# Patient Record
Sex: Female | Born: 1950 | Race: White | Hispanic: No | Smoking: Never smoker
Health system: Southern US, Community
[De-identification: ages and names within clinical notes are randomized; demographics above are authoritative.]

## PROBLEM LIST (undated history)

## (undated) DIAGNOSIS — K219 Gastro-esophageal reflux disease without esophagitis: Secondary | ICD-10-CM

## (undated) DIAGNOSIS — D509 Iron deficiency anemia, unspecified: Secondary | ICD-10-CM

## (undated) DIAGNOSIS — I10 Essential (primary) hypertension: Secondary | ICD-10-CM

## (undated) DIAGNOSIS — K7460 Unspecified cirrhosis of liver: Secondary | ICD-10-CM

## (undated) DIAGNOSIS — F1020 Alcohol dependence, uncomplicated: Secondary | ICD-10-CM

## (undated) DIAGNOSIS — I82409 Acute embolism and thrombosis of unspecified deep veins of unspecified lower extremity: Secondary | ICD-10-CM

## (undated) DIAGNOSIS — E785 Hyperlipidemia, unspecified: Secondary | ICD-10-CM

## (undated) DIAGNOSIS — K859 Acute pancreatitis without necrosis or infection, unspecified: Secondary | ICD-10-CM

## (undated) DIAGNOSIS — D539 Nutritional anemia, unspecified: Secondary | ICD-10-CM

## (undated) DIAGNOSIS — F329 Major depressive disorder, single episode, unspecified: Secondary | ICD-10-CM

## (undated) DIAGNOSIS — F32A Depression, unspecified: Secondary | ICD-10-CM

## (undated) DIAGNOSIS — F411 Generalized anxiety disorder: Secondary | ICD-10-CM

---

## 2015-09-02 ENCOUNTER — Encounter (HOSPITAL_COMMUNITY): Payer: Self-pay | Admitting: Emergency Medicine

## 2015-09-02 ENCOUNTER — Emergency Department (HOSPITAL_COMMUNITY)
Admission: EM | Admit: 2015-09-02 | Discharge: 2015-09-02 | Disposition: A | Discharge status: Home / Self Care | Payer: No Typology Code available for payment source | Attending: Emergency Medicine | Admitting: Emergency Medicine

## 2015-09-02 DIAGNOSIS — Z79891 Long term (current) use of opiate analgesic: Secondary | ICD-10-CM | POA: Diagnosis not present

## 2015-09-02 DIAGNOSIS — F329 Major depressive disorder, single episode, unspecified: Secondary | ICD-10-CM | POA: Diagnosis not present

## 2015-09-02 DIAGNOSIS — F10920 Alcohol use, unspecified with intoxication, uncomplicated: Secondary | ICD-10-CM

## 2015-09-02 DIAGNOSIS — I10 Essential (primary) hypertension: Secondary | ICD-10-CM | POA: Insufficient documentation

## 2015-09-02 DIAGNOSIS — Z79899 Other long term (current) drug therapy: Secondary | ICD-10-CM | POA: Insufficient documentation

## 2015-09-02 DIAGNOSIS — F10120 Alcohol abuse with intoxication, uncomplicated: Secondary | ICD-10-CM | POA: Diagnosis not present

## 2015-09-02 DIAGNOSIS — Z792 Long term (current) use of antibiotics: Secondary | ICD-10-CM | POA: Insufficient documentation

## 2015-09-02 DIAGNOSIS — Z7902 Long term (current) use of antithrombotics/antiplatelets: Secondary | ICD-10-CM | POA: Insufficient documentation

## 2015-09-02 DIAGNOSIS — Z8719 Personal history of other diseases of the digestive system: Secondary | ICD-10-CM | POA: Diagnosis not present

## 2015-09-02 DIAGNOSIS — F10129 Alcohol abuse with intoxication, unspecified: Secondary | ICD-10-CM | POA: Diagnosis present

## 2015-09-02 HISTORY — DX: Depression, unspecified: F32.A

## 2015-09-02 HISTORY — DX: Major depressive disorder, single episode, unspecified: F32.9

## 2015-09-02 HISTORY — DX: Essential (primary) hypertension: I10

## 2015-09-02 HISTORY — DX: Acute pancreatitis without necrosis or infection, unspecified: K85.90

## 2015-09-02 LAB — COMPREHENSIVE METABOLIC PANEL
ALBUMIN: 4.5 g/dL (ref 3.5–5.0)
ALK PHOS: 112 U/L (ref 38–126)
ALT: 55 U/L — ABNORMAL HIGH (ref 14–54)
ANION GAP: 15 (ref 5–15)
AST: 170 U/L — ABNORMAL HIGH (ref 15–41)
BILIRUBIN TOTAL: 0.5 mg/dL (ref 0.3–1.2)
BUN: 23 mg/dL — ABNORMAL HIGH (ref 6–20)
CALCIUM: 9.4 mg/dL (ref 8.9–10.3)
CO2: 28 mmol/L (ref 22–32)
Chloride: 100 mmol/L — ABNORMAL LOW (ref 101–111)
Creatinine, Ser: 0.87 mg/dL (ref 0.44–1.00)
GLUCOSE: 109 mg/dL — AB (ref 65–99)
POTASSIUM: 4.6 mmol/L (ref 3.5–5.1)
Sodium: 143 mmol/L (ref 135–145)
TOTAL PROTEIN: 7.8 g/dL (ref 6.5–8.1)

## 2015-09-02 LAB — CBC WITH DIFFERENTIAL/PLATELET
BASOS ABS: 0 10*3/uL (ref 0.0–0.1)
BASOS PCT: 0 %
EOS ABS: 0.1 10*3/uL (ref 0.0–0.7)
Eosinophils Relative: 1 %
HCT: 39.2 % (ref 36.0–46.0)
HEMOGLOBIN: 12.8 g/dL (ref 12.0–15.0)
LYMPHS PCT: 26 %
Lymphs Abs: 1.6 10*3/uL (ref 0.7–4.0)
MCH: 37 pg — AB (ref 26.0–34.0)
MCHC: 32.7 g/dL (ref 30.0–36.0)
MCV: 113.3 fL — ABNORMAL HIGH (ref 78.0–100.0)
MONO ABS: 0.8 10*3/uL (ref 0.1–1.0)
Monocytes Relative: 13 %
NEUTROS PCT: 60 %
Neutro Abs: 3.6 10*3/uL (ref 1.7–7.7)
PLATELETS: 218 10*3/uL (ref 150–400)
RBC: 3.46 MIL/uL — ABNORMAL LOW (ref 3.87–5.11)
RDW: 16.2 % — ABNORMAL HIGH (ref 11.5–15.5)
WBC: 6.1 10*3/uL (ref 4.0–10.5)

## 2015-09-02 LAB — RAPID URINE DRUG SCREEN, HOSP PERFORMED
AMPHETAMINES: NOT DETECTED
Barbiturates: NOT DETECTED
Benzodiazepines: NOT DETECTED
COCAINE: NOT DETECTED
OPIATES: NOT DETECTED
TETRAHYDROCANNABINOL: NOT DETECTED

## 2015-09-02 LAB — ETHANOL
ALCOHOL ETHYL (B): 329 mg/dL — AB (ref <5)
ALCOHOL ETHYL (B): 195 mg/dL — AB (ref <5)

## 2015-09-02 LAB — LIPASE, BLOOD: Lipase: 137 U/L — ABNORMAL HIGH (ref 11–51)

## 2015-09-02 MED ORDER — LORAZEPAM 2 MG/ML IJ SOLN: 1.0000 mg | Freq: Four times a day (QID) | INTRAMUSCULAR | Status: DC | PRN

## 2015-09-02 MED ORDER — FOLIC ACID 1 MG PO TABS
1.0000 mg | ORAL_TABLET | Freq: Every day | ORAL | Status: DC
  Administered 2015-09-02: 1 mg via ORAL
  Filled 2015-09-02: qty 1

## 2015-09-02 MED ORDER — LORAZEPAM 2 MG/ML IJ SOLN: 0.0000 mg | Freq: Two times a day (BID) | INTRAMUSCULAR | Status: DC

## 2015-09-02 MED ORDER — LORAZEPAM 2 MG/ML IJ SOLN: 0.0000 mg | Freq: Four times a day (QID) | INTRAMUSCULAR | Status: DC

## 2015-09-02 MED ORDER — SODIUM CHLORIDE 0.9 % IV BOLUS (SEPSIS)
1000.0000 mL | Freq: Once | INTRAVENOUS | Status: AC
  Administered 2015-09-02: 1000 mL via INTRAVENOUS

## 2015-09-02 MED ORDER — THIAMINE HCL 100 MG/ML IJ SOLN
100.0000 mg | Freq: Every day | INTRAMUSCULAR | Status: DC
  Filled 2015-09-02: qty 2

## 2015-09-02 MED ORDER — ADULT MULTIVITAMIN W/MINERALS CH
1.0000 | ORAL_TABLET | Freq: Every day | ORAL | Status: DC
  Administered 2015-09-02: 1 via ORAL
  Filled 2015-09-02: qty 1

## 2015-09-02 MED ORDER — VITAMIN B-1 100 MG PO TABS
100.0000 mg | ORAL_TABLET | Freq: Every day | ORAL | Status: DC
  Administered 2015-09-02: 100 mg via ORAL
  Filled 2015-09-02: qty 1

## 2015-09-02 MED ORDER — LORAZEPAM 1 MG PO TABS: 1.0000 mg | ORAL_TABLET | Freq: Four times a day (QID) | ORAL | Status: DC | PRN

## 2015-09-02 NOTE — ED Notes (Signed)
Patient aware that an urine sample is needed. Patient states she is unable to void at this time. Patient encouraged to void when able.

## 2015-09-02 NOTE — BH Assessment (Addendum)
Per Arline Aspindy at [facility] [phone], patient will be accepted back to the facility once her BAL is 200 or below and patient is medically cleared. [facility] #[phone].

## 2015-09-02 NOTE — ED Notes (Signed)
MD at bedside. 

## 2015-09-02 NOTE — ED Provider Notes (Signed)
CSN: [id number]     Arrival date & time 09/02/15  1007 History   First MD Initiated Contact with Patient 09/02/15 1014     Chief Complaint  Patient presents with  . Alcohol Problem    HPI Pt has been drinking heavily since March of this year.  She started drinking after her husband passed away in February.  Since then several other family members passed away and she has been very upset.  She drinks 1/2 pt vodka daily.  She went to [facility] today to get some help and she was sent to the ED for evaluation.  She denies abdominal pain.  No seizures.  No vomiting or diarrhea.  No tremors.  No history of DTs.  She has been depressed but denies SI or HI. Past Medical History  Diagnosis Date  . Hypertension   . Pancreatitis     x3  . Depression    History reviewed. No pertinent past surgical history. No family history on file. Social History  Substance Use Topics  . Smoking status: Never Smoker   . Smokeless tobacco: None  . Alcohol Use: Yes     Comment: 1/2 pint a day of vodka   OB History    No data available     Review of Systems  All other systems reviewed and are negative.     Allergies  Review of patient's allergies indicates no known allergies.  Home Medications   Prior to Admission medications   Medication Sig Start Date End Date Taking? Authorizing Provider  busPIRone (BUSPAR) 15 MG tablet Take 15 mg by mouth 3 (three) times daily.   Yes Historical Provider, MD  Calcium Citrate-Vitamin D (CALCIUM + D PO) Take 1 tablet by mouth daily.   Yes Historical Provider, MD  Cholecalciferol (VITAMIN D PO) Take 1 capsule by mouth daily.   Yes Historical Provider, MD  dabigatran (PRADAXA) 150 MG CAPS capsule Take 150 mg by mouth 2 (two) times daily.    Historical Provider, MD  DULoxetine (CYMBALTA) 60 MG capsule Take 60 mg by mouth daily.   Yes Historical Provider, MD  folic acid (FOLVITE) 1 MG tablet Take 1 mg by mouth daily.   Yes Historical Provider, MD  gabapentin  (NEURONTIN) 300 MG capsule Take 600 mg by mouth 3 (three) times daily.   Yes Historical Provider, MD  lisinopril (PRINIVIL,ZESTRIL) 20 MG tablet Take 20 mg by mouth daily.   Yes Historical Provider, MD  magnesium oxide (MAG-OX) 400 MG tablet Take 400 mg by mouth 3 (three) times daily.   Yes Historical Provider, MD  metroNIDAZOLE (METROGEL) 0.75 % vaginal gel Place 1 Applicatorful vaginally 2 (two) times daily.   Yes Historical Provider, MD  Multiple Vitamin (MULTIVITAMIN WITH MINERALS) TABS tablet Take 1 tablet by mouth daily.   Yes Historical Provider, MD  naltrexone (DEPADE) 50 MG tablet Take 50 mg by mouth daily.   Yes Historical Provider, MD  Omega-3 Fatty Acids (FISH OIL) 1000 MG CAPS Take 1 capsule by mouth at bedtime.   Yes Historical Provider, MD  pantoprazole (PROTONIX) 40 MG tablet Take 40 mg by mouth daily.   Yes Historical Provider, MD  thiamine 100 MG tablet Take 100 mg by mouth daily.   Yes Historical Provider, MD  traZODone (DESYREL) 50 MG tablet Take 50 mg by mouth at bedtime.   Yes Historical Provider, MD   BP 133/75 mmHg  Pulse 110  Temp(Src) 98.3 F (36.8 C) (Oral)  Resp 18  Ht 5'  3" (1.6 m)  Wt 165 lb (74.844 kg)  BMI 29.24 kg/m2  SpO2 94% Physical Exam  Constitutional: No distress.  HENT:  Head: Normocephalic and atraumatic.  Right Ear: External ear normal.  Left Ear: External ear normal.  Mild slurring of her speech   Eyes: Conjunctivae are normal. Right eye exhibits no discharge. Left eye exhibits no discharge. No scleral icterus.  Neck: Neck supple. No tracheal deviation present.  Cardiovascular: Normal rate, regular rhythm and intact distal pulses.   Pulmonary/Chest: Effort normal and breath sounds normal. No stridor. No respiratory distress. She has no wheezes. She has no rales.  Abdominal: Soft. Bowel sounds are normal. She exhibits no distension. There is no tenderness. There is no rebound and no guarding.  Musculoskeletal: She exhibits no edema or  tenderness.  Neurological: She is alert. She has normal strength. No cranial nerve deficit (no facial droop, extraocular movements intact, no slurred speech) or sensory deficit. She exhibits normal muscle tone. She displays no seizure activity. Coordination normal. GCS eye subscore is 4. GCS verbal subscore is 5. GCS motor subscore is 6.  Mild slurred speech   Skin: Skin is warm and dry. No rash noted.  Psychiatric: She has a normal mood and affect.  Nursing note and vitals reviewed.   ED Course  Procedures (including critical care time) Labs Review Labs Reviewed  COMPREHENSIVE METABOLIC PANEL - Abnormal; Notable for the following:    Chloride 100 (*)    Glucose, Bld 109 (*)    BUN 23 (*)    AST 170 (*)    ALT 55 (*)    All other components within normal limits  ETHANOL - Abnormal; Notable for the following:    Alcohol, Ethyl (B) 329 (*)    All other components within normal limits  CBC WITH DIFFERENTIAL/PLATELET - Abnormal; Notable for the following:    RBC 3.46 (*)    MCV 113.3 (*)    MCH 37.0 (*)    RDW 16.2 (*)    All other components within normal limits  LIPASE, BLOOD - Abnormal; Notable for the following:    Lipase 137 (*)    All other components within normal limits  ETHANOL - Abnormal; Notable for the following:    Alcohol, Ethyl (B) 195 (*)    All other components within normal limits  URINE RAPID DRUG SCREEN, HOSP PERFORMED    I have personally reviewed and evaluated these images and lab results as part of my medical decision-making.    MDM   Final diagnoses:  Alcohol intoxication, uncomplicated (HCC)    The patient's laboratory tests show that the patient is significantly intoxicated. She remains alert and awake. She does not appear tremulous. Patient does not appear to have any signs of acute alcohol withdrawal.  She does have an elevation of her lipase. Currently she is not experiencing any abdominal pain.  We will contact will contact [facility]  to see if she will be able to return.  [facility] requested a repeat alcohol level. They will accept once below 200. I attempted to call them directly as requested.  I left a message on the answering machine.  Alcohol level is less than 195.  Pt is stable for discharge.  No sx of withdrawal  Linwood Dibbles, MD 09/03/15 [phone]

## 2015-09-02 NOTE — ED Notes (Signed)
Bed: [id number] Expected date:  Expected time:  Means of arrival:  Comments: [facility] detox

## 2015-09-02 NOTE — Discharge Instructions (Signed)
Alcohol Intoxication  Alcohol intoxication occurs when you drink enough alcohol that it affects your ability to function. It can be mild or very severe. Drinking a lot of alcohol in a short time is called binge drinking. This can be very harmful. Drinking alcohol can also be more dangerous if you are taking medicines or other drugs. Some of the effects caused by alcohol may include:  · Loss of coordination.  · Changes in mood and behavior.  · Unclear thinking.  · Trouble talking (slurred speech).  · Throwing up (vomiting).  · Confusion.  · Slowed breathing.  · Twitching and shaking (seizures).  · Loss of consciousness.  HOME CARE  · Do not drive after drinking alcohol.  · Drink enough water and fluids to keep your pee (urine) clear or pale yellow. Avoid caffeine.  · Only take medicine as told by your doctor.  GET HELP IF:  · You throw up (vomit) many times.  · You do not feel better after a few days.  · You frequently have alcohol intoxication. Your doctor can help decide if you should see a substance use treatment counselor.  GET HELP RIGHT AWAY IF:  · You become shaky when you stop drinking.  · You have twitching and shaking.  · You throw up blood. It may look bright red or like coffee grounds.  · You notice blood in your poop (bowel movements).  · You become lightheaded or pass out (faint).  MAKE SURE YOU:   · Understand these instructions.  · Will watch your condition.  · Will get help right away if you are not doing well or get worse.     This information is not intended to replace advice given to you by your health care provider. Make sure you discuss any questions you have with your health care provider.     Document Released: 04/05/2008 Document Revised: 06/20/2013 Document Reviewed: 03/23/2013  Elsevier Interactive Patient Education ©2016 [employer]

## 2015-09-02 NOTE — ED Notes (Signed)
Per EMS patient had a 1/2 pint vodka today, which is what the patient usually drinks everyday. Patient wants detox and admits she needs help. Denies SI/HI.

## 2015-09-04 ENCOUNTER — Emergency Department (HOSPITAL_COMMUNITY): Payer: No Typology Code available for payment source

## 2015-09-04 ENCOUNTER — Emergency Department (HOSPITAL_COMMUNITY)
Admission: EM | Admit: 2015-09-04 | Discharge: 2015-09-04 | Disposition: A | Discharge status: Home / Self Care | Payer: No Typology Code available for payment source | Attending: Emergency Medicine | Admitting: Emergency Medicine

## 2015-09-04 ENCOUNTER — Encounter (HOSPITAL_COMMUNITY): Payer: Self-pay | Admitting: Emergency Medicine

## 2015-09-04 DIAGNOSIS — Z79899 Other long term (current) drug therapy: Secondary | ICD-10-CM | POA: Insufficient documentation

## 2015-09-04 DIAGNOSIS — R42 Dizziness and giddiness: Secondary | ICD-10-CM | POA: Diagnosis not present

## 2015-09-04 DIAGNOSIS — F329 Major depressive disorder, single episode, unspecified: Secondary | ICD-10-CM | POA: Insufficient documentation

## 2015-09-04 DIAGNOSIS — Z8719 Personal history of other diseases of the digestive system: Secondary | ICD-10-CM | POA: Diagnosis not present

## 2015-09-04 DIAGNOSIS — I10 Essential (primary) hypertension: Secondary | ICD-10-CM | POA: Diagnosis not present

## 2015-09-04 LAB — ETHANOL: Alcohol, Ethyl (B): <5 mg/dL (ref <5)

## 2015-09-04 LAB — COMPREHENSIVE METABOLIC PANEL
ALBUMIN: 3.5 g/dL (ref 3.5–5.0)
ALK PHOS: 99 U/L (ref 38–126)
ALT: 40 U/L (ref 14–54)
AST: 72 U/L — AB (ref 15–41)
Anion gap: 9 (ref 5–15)
BILIRUBIN TOTAL: 0.4 mg/dL (ref 0.3–1.2)
BUN: 21 mg/dL — AB (ref 6–20)
CO2: 31 mmol/L (ref 22–32)
Calcium: 9.6 mg/dL (ref 8.9–10.3)
Chloride: 99 mmol/L — ABNORMAL LOW (ref 101–111)
Creatinine, Ser: 1.2 mg/dL — ABNORMAL HIGH (ref 0.44–1.00)
GFR calc Af Amer: 55 mL/min — ABNORMAL LOW (ref >60)
GFR calc non Af Amer: 47 mL/min — ABNORMAL LOW (ref >60)
GLUCOSE: 85 mg/dL (ref 65–99)
POTASSIUM: 4.5 mmol/L (ref 3.5–5.1)
Sodium: 139 mmol/L (ref 135–145)
TOTAL PROTEIN: 6.1 g/dL — AB (ref 6.5–8.1)

## 2015-09-04 LAB — CBC
HEMATOCRIT: 33.5 % — AB (ref 36.0–46.0)
HEMOGLOBIN: 11 g/dL — AB (ref 12.0–15.0)
MCH: 37.4 pg — ABNORMAL HIGH (ref 26.0–34.0)
MCHC: 32.8 g/dL (ref 30.0–36.0)
MCV: 113.9 fL — ABNORMAL HIGH (ref 78.0–100.0)
Platelets: 161 10*3/uL (ref 150–400)
RBC: 2.94 MIL/uL — AB (ref 3.87–5.11)
RDW: 14.9 % (ref 11.5–15.5)
WBC: 3.8 10*3/uL — AB (ref 4.0–10.5)

## 2015-09-04 LAB — DIFFERENTIAL
BASOS ABS: 0 10*3/uL (ref 0.0–0.1)
Basophils Relative: 0 %
EOS PCT: 3 %
Eosinophils Absolute: 0.1 10*3/uL (ref 0.0–0.7)
Lymphocytes Relative: 41 %
Lymphs Abs: 1.6 10*3/uL (ref 0.7–4.0)
MONO ABS: 0.3 10*3/uL (ref 0.1–1.0)
MONOS PCT: 9 %
Neutro Abs: 1.8 10*3/uL (ref 1.7–7.7)
Neutrophils Relative %: 47 %

## 2015-09-04 LAB — I-STAT TROPONIN, ED: Troponin i, poc: 0.02 ng/mL (ref 0.00–0.08)

## 2015-09-04 LAB — PROTIME-INR
INR: 1.37 (ref 0.00–1.49)
Prothrombin Time: 16.9 seconds — ABNORMAL HIGH (ref 11.6–15.2)

## 2015-09-04 LAB — APTT: APTT: 53 s — AB (ref 24–37)

## 2015-09-04 LAB — POC OCCULT BLOOD, ED: Fecal Occult Bld: NEGATIVE

## 2015-09-04 MED ORDER — SODIUM CHLORIDE 0.9 % IV BOLUS (SEPSIS)
500.0000 mL | Freq: Once | INTRAVENOUS | Status: AC
  Administered 2015-09-04: 500 mL via INTRAVENOUS

## 2015-09-04 NOTE — ED Notes (Signed)
12 lead by ems unremakrable, pt was not positive orthostatic.

## 2015-09-04 NOTE — ED Notes (Signed)
Pt ambulated in hall. Pt unsteady and stated "my legs don't feel right, they are weak".

## 2015-09-04 NOTE — Discharge Instructions (Signed)

## 2015-09-04 NOTE — ED Notes (Signed)
Per [facility], pt from [facility] for alcohol rehab, last drink was three days ago. Pt sitting down and stood up, got dizzy and "foggy headed", stumbling around. Pt also states she feels very drowsy and almost fell asleep in her class which is not normal for her. AAOX4, denies pain.

## 2015-09-04 NOTE — ED Provider Notes (Signed)
CSN: [id number]     Arrival date & time 09/04/15  1755 History   First MD Initiated Contact with Patient 09/04/15 1757     Chief Complaint  Patient presents with  . Dizziness   HPI Patient presents to the emergency room for evaluation of dizziness and unsteadiness. I saw the patient in the emergency department 2 days ago at that time, the patient presented for troubles with chronic alcohol abuse. Patient was intoxicated and after her alcohol level decreased patient was sent to [facility] for further outpatient treatment of her alcohol addiction. Patient states today she was having trouble where she felt foggy headed and lightheaded. This occurred when she started to walk around. Patient felt very drowsy normal spell sleeping class. When she tried to walk she did feel like her balance was off and she was stumbling. Patient has been taking Ativan for alcohol withdrawal symptoms. She was sent in from facility for further evaluation. Past Medical History  Diagnosis Date  . Hypertension   . Pancreatitis     x3  . Depression    History reviewed. No pertinent past surgical history. No family history on file. Social History  Substance Use Topics  . Smoking status: Never Smoker   . Smokeless tobacco: None  . Alcohol Use: Yes     Comment: 1/2 pint a day of vodka   OB History    No data available     Review of Systems  All other systems reviewed and are negative.     Allergies  Review of patient's allergies indicates no known allergies.  Home Medications   Prior to Admission medications   Medication Sig Start Date End Date Taking? Authorizing Provider  alum & mag hydroxide-simeth (MAALOX/MYLANTA) 200-200-20 MG/5ML suspension Take 30 mLs by mouth every 6 (six) hours as needed for indigestion or heartburn.   Yes Historical Provider, MD  busPIRone (BUSPAR) 15 MG tablet Take 15 mg by mouth 3 (three) times daily.   Yes Historical Provider, MD  Calcium Citrate-Vitamin D (CALCIUM + D PO)  Take 1 tablet by mouth daily.   Yes Historical Provider, MD  chlordiazePOXIDE (LIBRIUM) 10 MG capsule Take 5-20 mg by mouth 4 (four) times daily. 20 mg x 4 doses, 10 mg x 4 doses, 5 mg x 4 doses   Yes Historical Provider, MD  Cholecalciferol (VITAMIN D PO) Take 1 capsule by mouth daily.   Yes Historical Provider, MD  dabigatran (PRADAXA) 150 MG CAPS capsule Take 150 mg by mouth 2 (two) times daily.   Yes Historical Provider, MD  dicyclomine (BENTYL) 20 MG tablet Take 20 mg by mouth 2 (two) times daily as needed for spasms.   Yes Historical Provider, MD  DULoxetine (CYMBALTA) 60 MG capsule Take 60 mg by mouth daily.   Yes Historical Provider, MD  folic acid (FOLVITE) 1 MG tablet Take 1 mg by mouth daily.   Yes Historical Provider, MD  gabapentin (NEURONTIN) 300 MG capsule Take 600 mg by mouth 3 (three) times daily.   Yes Historical Provider, MD  guaiFENesin (MUCINEX) 600 MG 12 hr tablet Take 600 mg by mouth 2 (two) times daily.   Yes Historical Provider, MD  lisinopril (PRINIVIL,ZESTRIL) 20 MG tablet Take 20 mg by mouth daily.   Yes Historical Provider, MD  loperamide (IMODIUM A-D) 2 MG tablet Take 2 mg by mouth 4 (four) times daily as needed for diarrhea or loose stools.   Yes Historical Provider, MD  magnesium oxide (MAG-OX) 400 MG tablet Take  400 mg by mouth 3 (three) times daily.   Yes Historical Provider, MD  methocarbamol (ROBAXIN) 500 MG tablet Take 500 mg by mouth every 8 (eight) hours as needed for muscle spasms.   Yes Historical Provider, MD  metroNIDAZOLE (METROGEL) 0.75 % vaginal gel Place 1 Applicatorful vaginally 2 (two) times daily as needed (yeast infections).    Yes Historical Provider, MD  Multiple Vitamin (MULTIVITAMIN WITH MINERALS) TABS tablet Take 1 tablet by mouth daily.   Yes Historical Provider, MD  naltrexone (DEPADE) 50 MG tablet Take 50 mg by mouth daily.   Yes Historical Provider, MD  Omega-3 Fatty Acids (FISH OIL) 1000 MG CAPS Take 1 capsule by mouth at bedtime.   Yes  Historical Provider, MD  pantoprazole (PROTONIX) 40 MG tablet Take 40 mg by mouth daily.   Yes Historical Provider, MD  thiamine 100 MG tablet Take 100 mg by mouth daily.   Yes Historical Provider, MD  traZODone (DESYREL) 50 MG tablet Take 50 mg by mouth at bedtime.   Yes Historical Provider, MD   BP 131/81 mmHg  Pulse 89  Temp(Src) 98.3 F (36.8 C) (Oral)  Resp 17  SpO2 93% Physical Exam  Constitutional: She is oriented to person, place, and time. She appears well-developed and well-nourished. No distress.  HENT:  Head: Normocephalic and atraumatic.  Right Ear: External ear normal.  Left Ear: External ear normal.  Mouth/Throat: Oropharynx is clear and moist.  Eyes: Conjunctivae are normal. Right eye exhibits no discharge. Left eye exhibits no discharge. No scleral icterus.  Neck: Neck supple. No tracheal deviation present.  Cardiovascular: Normal rate, regular rhythm and intact distal pulses.   Pulmonary/Chest: Effort normal and breath sounds normal. No stridor. No respiratory distress. She has no wheezes. She has no rales.  Abdominal: Soft. Bowel sounds are normal. She exhibits no distension. There is no tenderness. There is no rebound and no guarding.  Musculoskeletal: She exhibits no edema or tenderness.  Neurological: She is alert and oriented to person, place, and time. She has normal strength. No cranial nerve deficit (No facial droop, extraocular movements intact, tongue midline ) or sensory deficit. She exhibits normal muscle tone. She displays no seizure activity. Coordination normal.  No pronator drift bilateral upper extrem, able to hold both legs off bed for 5 seconds, sensation intact in all extremities, no visual field cuts, no left or right sided neglect, normal finger-nose exam bilaterally, no nystagmus noted   Skin: Skin is warm and dry. No rash noted.  Psychiatric: She has a normal mood and affect.  Nursing note and vitals reviewed.   ED Course  Procedures  (including critical care time) Labs Review Labs Reviewed  PROTIME-INR - Abnormal; Notable for the following:    Prothrombin Time 16.9 (*)    All other components within normal limits  APTT - Abnormal; Notable for the following:    aPTT 53 (*)    All other components within normal limits  CBC - Abnormal; Notable for the following:    WBC 3.8 (*)    RBC 2.94 (*)    Hemoglobin 11.0 (*)    HCT 33.5 (*)    MCV 113.9 (*)    MCH 37.4 (*)    All other components within normal limits  COMPREHENSIVE METABOLIC PANEL - Abnormal; Notable for the following:    Chloride 99 (*)    BUN 21 (*)    Creatinine, Ser 1.20 (*)    Total Protein 6.1 (*)    AST 72 (*)  GFR calc non Af Amer 47 (*)    GFR calc Af Amer 55 (*)    All other components within normal limits  ETHANOL  DIFFERENTIAL  I-STAT TROPOININ, ED  POC OCCULT BLOOD, ED    Imaging Review Mr Brain Wo Contrast  09/04/2015  CLINICAL DATA:  Dizziness. Imbalance. Alcohol abuse with recent withdrawal. EXAM: MRI HEAD WITHOUT CONTRAST TECHNIQUE: Multiplanar, multiecho pulse sequences of the brain and surrounding structures were obtained without intravenous contrast. COMPARISON:  None. FINDINGS: Mild atrophy and periventricular T2 changes bilaterally are within normal limits for age. No acute infarct, hemorrhage, or mass lesion is present. The ventricles are of normal size. No significant extraaxial fluid collection is present. Mild white matter changes extend into the brainstem. The cerebellum is unremarkable. Flow is present in the major intracranial arteries. Globes orbits are intact. The paranasal sinuses and mastoid air cells are clear. The skullbase is within normal limits. Midline structures are unremarkable. IMPRESSION: 1. Mild atrophy and white matter changes are within normal limits for age. 2. No acute or focal intracranial abnormality to explain the patient's symptoms. Electronically Signed   By: Marin Robertshristopher  Mattern M.D.   On: 09/04/2015  20:18   I have personally reviewed and evaluated these images and lab results as part of my medical decision-making.   EKG Interpretation   Date/Time:  Thursday September 04 2015 18:32:12 EDT Ventricular Rate:  95 PR Interval:  189 QRS Duration: 74 QT Interval:  352 QTC Calculation: 442 R Axis:   22 Text Interpretation:  Sinus rhythm Consider anterior infarct No old  tracing to compare Confirmed by Clementina Mareno  MD-J, Sylwia Cuervo ([id number]) on 09/04/2015  6:41:14 PM      MDM   Final diagnoses:  Dizziness    Patient's symptoms started this afternoon. Her NIH stroke score is 0 on my exam.  Do not think she is a code stroke/TPA candidate. I will order an MRI of the brain to rule out any occult history circulation stroke.  SX may be related to her benzodiazepine medication.  Patient's MRI does not show any evidence of acute abnormality.  Her unsteadiness could be related to her medications. It also is possible that vitamin deficiency associated with her alcohol abuse could be a contributing factor. She is currently on supplemental folic acid and thiamine.  She has mild renal insufficiency and anemia. Guaiac is negative. She does not appear to have an acute GI bleed. I doubt that these would be the cause of her symptoms  At this time there does not appear to be any evidence of an acute emergency medical condition and the patient appears stable for discharge with appropriate outpatient follow up.  She will need to follow up with her primary care doctor  to evaluate further     Linwood DibblesJon Seanmichael Salmons, MD 09/04/15 2225

## 2015-09-07 ENCOUNTER — Inpatient Hospital Stay (HOSPITAL_COMMUNITY)
Admission: EM | Admit: 2015-09-07 | Discharge: 2015-09-16 | DRG: 871 | Disposition: A | Discharge status: Other Acute Inpatient Hospital | Payer: No Typology Code available for payment source | Attending: Internal Medicine | Admitting: Internal Medicine

## 2015-09-07 ENCOUNTER — Emergency Department (HOSPITAL_COMMUNITY): Payer: No Typology Code available for payment source

## 2015-09-07 ENCOUNTER — Encounter (HOSPITAL_COMMUNITY): Payer: Self-pay

## 2015-09-07 DIAGNOSIS — A419 Sepsis, unspecified organism: Principal | ICD-10-CM | POA: Diagnosis present

## 2015-09-07 DIAGNOSIS — E8770 Fluid overload, unspecified: Secondary | ICD-10-CM | POA: Diagnosis present

## 2015-09-07 DIAGNOSIS — R0602 Shortness of breath: Secondary | ICD-10-CM | POA: Diagnosis not present

## 2015-09-07 DIAGNOSIS — F411 Generalized anxiety disorder: Secondary | ICD-10-CM | POA: Diagnosis present

## 2015-09-07 DIAGNOSIS — Z86718 Personal history of other venous thrombosis and embolism: Secondary | ICD-10-CM | POA: Diagnosis not present

## 2015-09-07 DIAGNOSIS — I119 Hypertensive heart disease without heart failure: Secondary | ICD-10-CM | POA: Diagnosis present

## 2015-09-07 DIAGNOSIS — Z7901 Long term (current) use of anticoagulants: Secondary | ICD-10-CM | POA: Diagnosis not present

## 2015-09-07 DIAGNOSIS — Z807 Family history of other malignant neoplasms of lymphoid, hematopoietic and related tissues: Secondary | ICD-10-CM | POA: Diagnosis not present

## 2015-09-07 DIAGNOSIS — F329 Major depressive disorder, single episode, unspecified: Secondary | ICD-10-CM | POA: Diagnosis not present

## 2015-09-07 DIAGNOSIS — K219 Gastro-esophageal reflux disease without esophagitis: Secondary | ICD-10-CM | POA: Diagnosis present

## 2015-09-07 DIAGNOSIS — F1020 Alcohol dependence, uncomplicated: Secondary | ICD-10-CM | POA: Diagnosis not present

## 2015-09-07 DIAGNOSIS — I248 Other forms of acute ischemic heart disease: Secondary | ICD-10-CM | POA: Diagnosis present

## 2015-09-07 DIAGNOSIS — E876 Hypokalemia: Secondary | ICD-10-CM | POA: Diagnosis present

## 2015-09-07 DIAGNOSIS — E0781 Sick-euthyroid syndrome: Secondary | ICD-10-CM | POA: Diagnosis present

## 2015-09-07 DIAGNOSIS — D509 Iron deficiency anemia, unspecified: Secondary | ICD-10-CM | POA: Diagnosis not present

## 2015-09-07 DIAGNOSIS — J9601 Acute respiratory failure with hypoxia: Secondary | ICD-10-CM | POA: Diagnosis not present

## 2015-09-07 DIAGNOSIS — Z79899 Other long term (current) drug therapy: Secondary | ICD-10-CM

## 2015-09-07 DIAGNOSIS — I82409 Acute embolism and thrombosis of unspecified deep veins of unspecified lower extremity: Secondary | ICD-10-CM | POA: Diagnosis not present

## 2015-09-07 DIAGNOSIS — F32A Depression, unspecified: Secondary | ICD-10-CM | POA: Diagnosis present

## 2015-09-07 DIAGNOSIS — R7989 Other specified abnormal findings of blood chemistry: Secondary | ICD-10-CM | POA: Diagnosis not present

## 2015-09-07 DIAGNOSIS — K7030 Alcoholic cirrhosis of liver without ascites: Secondary | ICD-10-CM | POA: Diagnosis not present

## 2015-09-07 DIAGNOSIS — J189 Pneumonia, unspecified organism: Secondary | ICD-10-CM | POA: Diagnosis not present

## 2015-09-07 DIAGNOSIS — R778 Other specified abnormalities of plasma proteins: Secondary | ICD-10-CM | POA: Diagnosis present

## 2015-09-07 DIAGNOSIS — K7460 Unspecified cirrhosis of liver: Secondary | ICD-10-CM | POA: Diagnosis present

## 2015-09-07 DIAGNOSIS — E785 Hyperlipidemia, unspecified: Secondary | ICD-10-CM | POA: Diagnosis present

## 2015-09-07 DIAGNOSIS — R0600 Dyspnea, unspecified: Secondary | ICD-10-CM | POA: Diagnosis not present

## 2015-09-07 DIAGNOSIS — J811 Chronic pulmonary edema: Secondary | ICD-10-CM

## 2015-09-07 DIAGNOSIS — D539 Nutritional anemia, unspecified: Secondary | ICD-10-CM | POA: Diagnosis present

## 2015-09-07 DIAGNOSIS — Y95 Nosocomial condition: Secondary | ICD-10-CM | POA: Diagnosis present

## 2015-09-07 HISTORY — DX: Unspecified cirrhosis of liver: K74.60

## 2015-09-07 HISTORY — DX: Nutritional anemia, unspecified: D53.9

## 2015-09-07 HISTORY — DX: Hyperlipidemia, unspecified: E78.5

## 2015-09-07 HISTORY — DX: Generalized anxiety disorder: F41.1

## 2015-09-07 HISTORY — DX: Gastro-esophageal reflux disease without esophagitis: K21.9

## 2015-09-07 HISTORY — DX: Iron deficiency anemia, unspecified: D50.9

## 2015-09-07 HISTORY — DX: Alcohol dependence, uncomplicated: F10.20

## 2015-09-07 HISTORY — DX: Acute embolism and thrombosis of unspecified deep veins of unspecified lower extremity: I82.409

## 2015-09-07 LAB — URINALYSIS, ROUTINE W REFLEX MICROSCOPIC
Bilirubin Urine: NEGATIVE
Glucose, UA: >1000 mg/dL — AB
Hgb urine dipstick: NEGATIVE
Ketones, ur: NEGATIVE mg/dL
LEUKOCYTES UA: NEGATIVE
NITRITE: NEGATIVE
Protein, ur: NEGATIVE mg/dL
Specific Gravity, Urine: >1.046 — ABNORMAL HIGH (ref 1.005–1.030)
UROBILINOGEN UA: 0.2 mg/dL (ref 0.0–1.0)
pH: 5.5 (ref 5.0–8.0)

## 2015-09-07 LAB — PROCALCITONIN: Procalcitonin: 1.5 ng/mL

## 2015-09-07 LAB — COMPREHENSIVE METABOLIC PANEL
ALBUMIN: 3.4 g/dL — AB (ref 3.5–5.0)
ALT: 35 U/L (ref 14–54)
ANION GAP: 11 (ref 5–15)
AST: 48 U/L — AB (ref 15–41)
Alkaline Phosphatase: 86 U/L (ref 38–126)
BUN: 25 mg/dL — AB (ref 6–20)
CHLORIDE: 100 mmol/L — AB (ref 101–111)
CO2: 30 mmol/L (ref 22–32)
Calcium: 9 mg/dL (ref 8.9–10.3)
Creatinine, Ser: 1.02 mg/dL — ABNORMAL HIGH (ref 0.44–1.00)
GFR calc Af Amer: >60 mL/min (ref >60)
GFR calc non Af Amer: 57 mL/min — ABNORMAL LOW (ref >60)
GLUCOSE: 243 mg/dL — AB (ref 65–99)
POTASSIUM: 3.2 mmol/L — AB (ref 3.5–5.1)
SODIUM: 141 mmol/L (ref 135–145)
Total Bilirubin: 0.6 mg/dL (ref 0.3–1.2)
Total Protein: 6.5 g/dL (ref 6.5–8.1)

## 2015-09-07 LAB — FERRITIN: FERRITIN: 153 ng/mL (ref 11–307)

## 2015-09-07 LAB — BLOOD GAS, ARTERIAL
ACID-BASE DEFICIT: 1.8 mmol/L (ref 0.0–2.0)
Bicarbonate: 23.4 mEq/L (ref 20.0–24.0)
Drawn by: 441261
O2 Content: 2 L/min
O2 Saturation: 94.1 %
PCO2 ART: 44.7 mmHg (ref 35.0–45.0)
PO2 ART: 75.1 mmHg — AB (ref 80.0–100.0)
Patient temperature: 98.6
TCO2: 22 mmol/L (ref 0–100)
pH, Arterial: 7.34 — ABNORMAL LOW (ref 7.350–7.450)

## 2015-09-07 LAB — CBC WITH DIFFERENTIAL/PLATELET
BASOS ABS: 0 10*3/uL (ref 0.0–0.1)
Basophils Relative: 0 %
EOS ABS: 0 10*3/uL (ref 0.0–0.7)
Eosinophils Relative: 0 %
HEMATOCRIT: 34.1 % — AB (ref 36.0–46.0)
HEMOGLOBIN: 11.5 g/dL — AB (ref 12.0–15.0)
LYMPHS ABS: 0.9 10*3/uL (ref 0.7–4.0)
LYMPHS PCT: 14 %
MCH: 37.7 pg — ABNORMAL HIGH (ref 26.0–34.0)
MCHC: 33.7 g/dL (ref 30.0–36.0)
MCV: 111.8 fL — ABNORMAL HIGH (ref 78.0–100.0)
MONOS PCT: 8 %
Monocytes Absolute: 0.5 10*3/uL (ref 0.1–1.0)
NEUTROS ABS: 5 10*3/uL (ref 1.7–7.7)
Neutrophils Relative %: 78 %
Platelets: 162 10*3/uL (ref 150–400)
RBC: 3.05 MIL/uL — AB (ref 3.87–5.11)
RDW: 14.5 % (ref 11.5–15.5)
WBC: 6.4 10*3/uL (ref 4.0–10.5)

## 2015-09-07 LAB — INFLUENZA PANEL BY PCR (TYPE A & B)
H1N1FLUPCR: NOT DETECTED
Influenza A By PCR: NEGATIVE
Influenza B By PCR: NEGATIVE

## 2015-09-07 LAB — IRON AND TIBC
Iron: 8 ug/dL — ABNORMAL LOW (ref 28–170)
Saturation Ratios: 4 % — ABNORMAL LOW (ref 10.4–31.8)
TIBC: 227 ug/dL — ABNORMAL LOW (ref 250–450)
UIBC: 219 ug/dL

## 2015-09-07 LAB — I-STAT CG4 LACTIC ACID, ED
LACTIC ACID, VENOUS: 2.82 mmol/L — AB (ref 0.5–2.0)
LACTIC ACID, VENOUS: 2.94 mmol/L — AB (ref 0.5–2.0)

## 2015-09-07 LAB — TROPONIN I
TROPONIN I: 0.24 ng/mL — AB (ref <0.031)
TROPONIN I: 0.19 ng/mL — AB (ref <0.031)
TROPONIN I: 0.09 ng/mL — AB (ref <0.031)

## 2015-09-07 LAB — I-STAT TROPONIN, ED: Troponin i, poc: 0.24 ng/mL (ref 0.00–0.08)

## 2015-09-07 LAB — MAGNESIUM
Magnesium: 0.9 mg/dL — CL (ref 1.7–2.4)
Magnesium: 1 mg/dL — ABNORMAL LOW (ref 1.7–2.4)

## 2015-09-07 LAB — TYPE AND SCREEN
ABO/RH(D): O POS
ANTIBODY SCREEN: NEGATIVE

## 2015-09-07 LAB — LACTIC ACID, PLASMA
LACTIC ACID, VENOUS: 2.7 mmol/L — AB (ref 0.5–2.0)
Lactic Acid, Venous: 2.9 mmol/L (ref 0.5–2.0)
Lactic Acid, Venous: 2 mmol/L (ref 0.5–2.0)

## 2015-09-07 LAB — BRAIN NATRIURETIC PEPTIDE: B Natriuretic Peptide: 191 pg/mL — ABNORMAL HIGH (ref 0.0–100.0)

## 2015-09-07 LAB — MRSA PCR SCREENING: MRSA BY PCR: NEGATIVE

## 2015-09-07 LAB — URINE MICROSCOPIC-ADD ON

## 2015-09-07 LAB — TSH: TSH: 0.18 u[IU]/mL — ABNORMAL LOW (ref 0.350–4.500)

## 2015-09-07 LAB — ETHANOL

## 2015-09-07 LAB — APTT: aPTT: 43 seconds — ABNORMAL HIGH (ref 24–37)

## 2015-09-07 LAB — FOLATE: Folate: 31 ng/mL (ref >5.9)

## 2015-09-07 LAB — PROTIME-INR
INR: 1.22 (ref 0.00–1.49)
Prothrombin Time: 15.5 seconds — ABNORMAL HIGH (ref 11.6–15.2)

## 2015-09-07 LAB — RETICULOCYTES
RBC.: 2.68 MIL/uL — AB (ref 3.87–5.11)
RETIC COUNT ABSOLUTE: 99.2 10*3/uL (ref 19.0–186.0)
Retic Ct Pct: 3.7 % — ABNORMAL HIGH (ref 0.4–3.1)

## 2015-09-07 LAB — LIPASE, BLOOD: LIPASE: 57 U/L — AB (ref 11–51)

## 2015-09-07 LAB — VITAMIN B12: Vitamin B-12: 304 pg/mL (ref 180–914)

## 2015-09-07 LAB — STREP PNEUMONIAE URINARY ANTIGEN: Strep Pneumo Urinary Antigen: NEGATIVE

## 2015-09-07 MED ORDER — ADULT MULTIVITAMIN W/MINERALS CH: 1.0000 | ORAL_TABLET | Freq: Every day | ORAL | Status: DC

## 2015-09-07 MED ORDER — THIAMINE HCL 100 MG/ML IJ SOLN
Freq: Once | INTRAVENOUS | Status: AC
  Administered 2015-09-07: 14:00:00 via INTRAVENOUS
  Filled 2015-09-07: qty 1000

## 2015-09-07 MED ORDER — BUSPIRONE HCL 5 MG PO TABS
15.0000 mg | ORAL_TABLET | Freq: Three times a day (TID) | ORAL | Status: DC
  Administered 2015-09-07 – 2015-09-16 (×27): 15 mg via ORAL
  Filled 2015-09-07: qty 1
  Filled 2015-09-07: qty 3
  Filled 2015-09-07: qty 1
  Filled 2015-09-07: qty 3
  Filled 2015-09-07 (×2): qty 1
  Filled 2015-09-07: qty 3
  Filled 2015-09-07 (×4): qty 1
  Filled 2015-09-07: qty 3
  Filled 2015-09-07: qty 1
  Filled 2015-09-07: qty 3
  Filled 2015-09-07 (×2): qty 1
  Filled 2015-09-07: qty 3
  Filled 2015-09-07: qty 1
  Filled 2015-09-07: qty 3
  Filled 2015-09-07 (×3): qty 1
  Filled 2015-09-07 (×3): qty 3
  Filled 2015-09-07: qty 1
  Filled 2015-09-07: qty 3
  Filled 2015-09-07: qty 1
  Filled 2015-09-07 (×3): qty 3
  Filled 2015-09-07: qty 1
  Filled 2015-09-07: qty 3

## 2015-09-07 MED ORDER — METHOCARBAMOL 500 MG PO TABS
500.0000 mg | ORAL_TABLET | Freq: Three times a day (TID) | ORAL | Status: DC | PRN
  Administered 2015-09-09: 500 mg via ORAL
  Filled 2015-09-07: qty 1

## 2015-09-07 MED ORDER — GUAIFENESIN ER 600 MG PO TB12
1200.0000 mg | ORAL_TABLET | Freq: Two times a day (BID) | ORAL | Status: DC
Start: 2015-09-07 — End: 2015-09-16
  Administered 2015-09-07 – 2015-09-16 (×18): 1200 mg via ORAL
  Filled 2015-09-07 (×20): qty 2

## 2015-09-07 MED ORDER — IOHEXOL 300 MG/ML  SOLN
75.0000 mL | Freq: Once | INTRAMUSCULAR | Status: AC | PRN
  Administered 2015-09-07: 75 mL via INTRAVENOUS

## 2015-09-07 MED ORDER — VANCOMYCIN HCL IN DEXTROSE 1-5 GM/200ML-% IV SOLN
1000.0000 mg | Freq: Two times a day (BID) | INTRAVENOUS | Status: DC
  Administered 2015-09-07 – 2015-09-08 (×3): 1000 mg via INTRAVENOUS
  Filled 2015-09-07 (×3): qty 200

## 2015-09-07 MED ORDER — ADULT MULTIVITAMIN W/MINERALS CH
1.0000 | ORAL_TABLET | Freq: Every day | ORAL | Status: DC
  Administered 2015-09-07 – 2015-09-16 (×10): 1 via ORAL
  Filled 2015-09-07 (×10): qty 1

## 2015-09-07 MED ORDER — IPRATROPIUM BROMIDE 0.02 % IN SOLN
0.5000 mg | Freq: Four times a day (QID) | RESPIRATORY_TRACT | Status: DC
  Administered 2015-09-07 – 2015-09-09 (×7): 0.5 mg via RESPIRATORY_TRACT
  Filled 2015-09-07 (×6): qty 2.5

## 2015-09-07 MED ORDER — PIPERACILLIN-TAZOBACTAM 3.375 G IVPB 30 MIN
3.3750 g | Freq: Once | INTRAVENOUS | Status: AC
  Administered 2015-09-07: 3.375 g via INTRAVENOUS

## 2015-09-07 MED ORDER — LEVALBUTEROL HCL 0.63 MG/3ML IN NEBU
0.6300 mg | INHALATION_SOLUTION | Freq: Four times a day (QID) | RESPIRATORY_TRACT | Status: DC
  Administered 2015-09-07 – 2015-09-09 (×7): 0.63 mg via RESPIRATORY_TRACT
  Filled 2015-09-07 (×6): qty 3

## 2015-09-07 MED ORDER — LORAZEPAM 2 MG/ML IJ SOLN: 0.0000 mg | Freq: Two times a day (BID) | INTRAMUSCULAR | Status: AC

## 2015-09-07 MED ORDER — PANTOPRAZOLE SODIUM 40 MG PO TBEC
40.0000 mg | DELAYED_RELEASE_TABLET | Freq: Every day | ORAL | Status: DC
  Administered 2015-09-08 – 2015-09-16 (×9): 40 mg via ORAL
  Filled 2015-09-07 (×9): qty 1

## 2015-09-07 MED ORDER — VITAMIN B-1 100 MG PO TABS
100.0000 mg | ORAL_TABLET | Freq: Every day | ORAL | Status: DC
  Administered 2015-09-07 – 2015-09-16 (×9): 100 mg via ORAL
  Filled 2015-09-07 (×10): qty 1

## 2015-09-07 MED ORDER — MAGNESIUM SULFATE 4 GM/100ML IV SOLN
4.0000 g | Freq: Once | INTRAVENOUS | Status: AC
  Administered 2015-09-07: 4 g via INTRAVENOUS
  Filled 2015-09-07: qty 100

## 2015-09-07 MED ORDER — LEVALBUTEROL HCL 0.63 MG/3ML IN NEBU: 0.6300 mg | INHALATION_SOLUTION | Freq: Three times a day (TID) | RESPIRATORY_TRACT | Status: DC

## 2015-09-07 MED ORDER — THIAMINE HCL 100 MG/ML IJ SOLN
100.0000 mg | Freq: Every day | INTRAMUSCULAR | Status: DC
Start: 2015-09-07 — End: 2015-09-14
  Administered 2015-09-10: 100 mg via INTRAVENOUS
  Filled 2015-09-07: qty 2

## 2015-09-07 MED ORDER — LORAZEPAM 1 MG PO TABS: 1.0000 mg | ORAL_TABLET | Freq: Four times a day (QID) | ORAL | Status: AC | PRN

## 2015-09-07 MED ORDER — SODIUM CHLORIDE 0.9 % IV BOLUS (SEPSIS)
500.0000 mL | INTRAVENOUS | Status: AC
  Administered 2015-09-07: 500 mL via INTRAVENOUS

## 2015-09-07 MED ORDER — LORATADINE 10 MG PO TABS
10.0000 mg | ORAL_TABLET | Freq: Every day | ORAL | Status: DC
Start: 2015-09-08 — End: 2015-09-16
  Administered 2015-09-08 – 2015-09-16 (×9): 10 mg via ORAL
  Filled 2015-09-07 (×9): qty 1

## 2015-09-07 MED ORDER — MAGNESIUM OXIDE 400 (241.3 MG) MG PO TABS
400.0000 mg | ORAL_TABLET | Freq: Three times a day (TID) | ORAL | Status: DC
  Administered 2015-09-07 – 2015-09-16 (×27): 400 mg via ORAL
  Filled 2015-09-07 (×27): qty 1

## 2015-09-07 MED ORDER — LEVALBUTEROL HCL 0.63 MG/3ML IN NEBU: 0.6300 mg | INHALATION_SOLUTION | RESPIRATORY_TRACT | Status: DC | PRN

## 2015-09-07 MED ORDER — BENZONATATE 100 MG PO CAPS: 100.0000 mg | ORAL_CAPSULE | Freq: Three times a day (TID) | ORAL | Status: DC | PRN

## 2015-09-07 MED ORDER — PIPERACILLIN-TAZOBACTAM 3.375 G IVPB 30 MIN
3.3750 g | Freq: Three times a day (TID) | INTRAVENOUS | Status: DC
  Filled 2015-09-07: qty 50

## 2015-09-07 MED ORDER — ONDANSETRON HCL 4 MG/2ML IJ SOLN
4.0000 mg | Freq: Four times a day (QID) | INTRAMUSCULAR | Status: DC | PRN
Start: 2015-09-07 — End: 2015-09-16
  Administered 2015-09-09: 4 mg via INTRAVENOUS
  Filled 2015-09-07: qty 2

## 2015-09-07 MED ORDER — SODIUM CHLORIDE 0.9 % IV BOLUS (SEPSIS)
1000.0000 mL | INTRAVENOUS | Status: AC
  Administered 2015-09-07 (×2): 1000 mL via INTRAVENOUS

## 2015-09-07 MED ORDER — DICYCLOMINE HCL 20 MG PO TABS
20.0000 mg | ORAL_TABLET | Freq: Two times a day (BID) | ORAL | Status: DC | PRN
  Filled 2015-09-07: qty 1

## 2015-09-07 MED ORDER — IPRATROPIUM BROMIDE 0.02 % IN SOLN
0.5000 mg | RESPIRATORY_TRACT | Status: DC | PRN
  Filled 2015-09-07: qty 2.5

## 2015-09-07 MED ORDER — POTASSIUM CHLORIDE CRYS ER 20 MEQ PO TBCR
40.0000 meq | EXTENDED_RELEASE_TABLET | Freq: Once | ORAL | Status: AC
  Administered 2015-09-07: 40 meq via ORAL
  Filled 2015-09-07: qty 2

## 2015-09-07 MED ORDER — LORAZEPAM 2 MG/ML IJ SOLN
0.5000 mg | Freq: Once | INTRAMUSCULAR | Status: AC
  Administered 2015-09-07: 0.5 mg via INTRAVENOUS
  Filled 2015-09-07: qty 1

## 2015-09-07 MED ORDER — ASPIRIN 81 MG PO CHEW
CHEWABLE_TABLET | ORAL | Status: AC
  Administered 2015-09-07: 324 mg via ORAL
  Filled 2015-09-07: qty 4

## 2015-09-07 MED ORDER — SODIUM CHLORIDE 0.9 % IV SOLN
INTRAVENOUS | Status: DC
  Administered 2015-09-07 – 2015-09-08 (×2): via INTRAVENOUS

## 2015-09-07 MED ORDER — BENZOCAINE-MENTHOL 6-10 MG MT LOZG: 1.0000 | LOZENGE | OROMUCOSAL | Status: DC | PRN

## 2015-09-07 MED ORDER — IBUPROFEN 200 MG PO TABS
400.0000 mg | ORAL_TABLET | Freq: Four times a day (QID) | ORAL | Status: DC | PRN
  Administered 2015-09-08 – 2015-09-11 (×6): 400 mg via ORAL
  Filled 2015-09-07 (×6): qty 2

## 2015-09-07 MED ORDER — VANCOMYCIN HCL IN DEXTROSE 1-5 GM/200ML-% IV SOLN
1000.0000 mg | Freq: Once | INTRAVENOUS | Status: AC
  Administered 2015-09-07: 1000 mg via INTRAVENOUS
  Filled 2015-09-07: qty 200

## 2015-09-07 MED ORDER — TRAZODONE HCL 50 MG PO TABS
50.0000 mg | ORAL_TABLET | Freq: Every day | ORAL | Status: DC
  Administered 2015-09-07 – 2015-09-15 (×9): 50 mg via ORAL
  Filled 2015-09-07 (×9): qty 1

## 2015-09-07 MED ORDER — VANCOMYCIN HCL IN DEXTROSE 1-5 GM/200ML-% IV SOLN: 1000.0000 mg | Freq: Once | INTRAVENOUS | Status: DC

## 2015-09-07 MED ORDER — ASPIRIN 81 MG PO CHEW
324.0000 mg | CHEWABLE_TABLET | Freq: Once | ORAL | Status: AC
  Administered 2015-09-07: 324 mg via ORAL

## 2015-09-07 MED ORDER — ASPIRIN 325 MG PO TABS: 325.0000 mg | ORAL_TABLET | Freq: Once | ORAL | Status: DC

## 2015-09-07 MED ORDER — ACETAMINOPHEN 500 MG PO TABS
1000.0000 mg | ORAL_TABLET | Freq: Once | ORAL | Status: AC
  Administered 2015-09-07: 1000 mg via ORAL
  Filled 2015-09-07: qty 2

## 2015-09-07 MED ORDER — FOLIC ACID 1 MG PO TABS
1.0000 mg | ORAL_TABLET | Freq: Every day | ORAL | Status: DC
  Administered 2015-09-07 – 2015-09-16 (×10): 1 mg via ORAL
  Filled 2015-09-07 (×10): qty 1

## 2015-09-07 MED ORDER — SODIUM CHLORIDE 0.9 % IV BOLUS (SEPSIS)
1000.0000 mL | Freq: Once | INTRAVENOUS | Status: AC
  Administered 2015-09-07: 1000 mL via INTRAVENOUS

## 2015-09-07 MED ORDER — LOPERAMIDE HCL 2 MG PO CAPS: 2.0000 mg | ORAL_CAPSULE | Freq: Four times a day (QID) | ORAL | Status: DC | PRN

## 2015-09-07 MED ORDER — LORAZEPAM 2 MG/ML IJ SOLN
1.0000 mg | Freq: Four times a day (QID) | INTRAMUSCULAR | Status: AC | PRN
  Administered 2015-09-09: 1 mg via INTRAVENOUS
  Filled 2015-09-07: qty 1

## 2015-09-07 MED ORDER — LORAZEPAM 2 MG/ML IJ SOLN
0.0000 mg | Freq: Four times a day (QID) | INTRAMUSCULAR | Status: AC
  Administered 2015-09-07 – 2015-09-09 (×2): 1 mg via INTRAVENOUS
  Filled 2015-09-07 (×2): qty 1

## 2015-09-07 MED ORDER — LIDOCAINE 5 % EX PTCH
1.0000 | MEDICATED_PATCH | CUTANEOUS | Status: DC
  Administered 2015-09-08 – 2015-09-16 (×9): 1 via TRANSDERMAL
  Filled 2015-09-07 (×12): qty 1

## 2015-09-07 MED ORDER — DULOXETINE HCL 60 MG PO CPEP
60.0000 mg | ORAL_CAPSULE | Freq: Every day | ORAL | Status: DC
  Administered 2015-09-08 – 2015-09-16 (×9): 60 mg via ORAL
  Filled 2015-09-07: qty 2
  Filled 2015-09-07: qty 1
  Filled 2015-09-07: qty 2
  Filled 2015-09-07 (×3): qty 1
  Filled 2015-09-07: qty 2
  Filled 2015-09-07: qty 1
  Filled 2015-09-07: qty 2

## 2015-09-07 MED ORDER — GABAPENTIN 300 MG PO CAPS
600.0000 mg | ORAL_CAPSULE | Freq: Three times a day (TID) | ORAL | Status: DC
  Administered 2015-09-07 – 2015-09-16 (×27): 600 mg via ORAL
  Filled 2015-09-07 (×41): qty 2

## 2015-09-07 MED ORDER — PIPERACILLIN-TAZOBACTAM 3.375 G IVPB
3.3750 g | Freq: Three times a day (TID) | INTRAVENOUS | Status: DC
  Administered 2015-09-07 – 2015-09-15 (×23): 3.375 g via INTRAVENOUS
  Filled 2015-09-07 (×23): qty 50

## 2015-09-07 MED ORDER — DABIGATRAN ETEXILATE MESYLATE 150 MG PO CAPS
150.0000 mg | ORAL_CAPSULE | Freq: Two times a day (BID) | ORAL | Status: DC
  Administered 2015-09-07 – 2015-09-16 (×18): 150 mg via ORAL
  Filled 2015-09-07 (×21): qty 1

## 2015-09-07 MED ORDER — PIPERACILLIN-TAZOBACTAM 3.375 G IVPB 30 MIN: 3.3750 g | Freq: Three times a day (TID) | INTRAVENOUS | Status: DC

## 2015-09-07 MED ORDER — LEVOFLOXACIN IN D5W 750 MG/150ML IV SOLN
750.0000 mg | Freq: Once | INTRAVENOUS | Status: DC
  Filled 2015-09-07: qty 150

## 2015-09-07 MED ORDER — PIPERACILLIN-TAZOBACTAM 3.375 G IVPB
3.3750 g | Freq: Once | INTRAVENOUS | Status: DC
  Filled 2015-09-07: qty 50

## 2015-09-07 MED ORDER — OSELTAMIVIR PHOSPHATE 75 MG PO CAPS
75.0000 mg | ORAL_CAPSULE | Freq: Two times a day (BID) | ORAL | Status: DC
  Administered 2015-09-07: 75 mg via ORAL
  Filled 2015-09-07 (×3): qty 1

## 2015-09-07 NOTE — Progress Notes (Signed)
Rt gave pt flutter valve. Pt knows and understands how to use. 

## 2015-09-07 NOTE — Progress Notes (Signed)
ANTIBIOTIC CONSULT NOTE - INITIAL  Pharmacy Consult for vancomycin and zosyn Indication: rule out sepsis, PNA  No Known Allergies  Patient Measurements: Height: 5\' 3"  (160 cm) Weight: 165 lb (74.844 kg) IBW/kg (Calculated) : 52.4  Vital Signs: Temp: 99.3 F (37.4 C) (11/06 1134) Temp Source: Axillary (11/06 1134) BP: 116/79 mmHg (11/06 1330) Pulse Rate: 107 (11/06 1330) Intake/Output from previous day:   Intake/Output from this shift: Total I/O In: 2350 [I.V.:2350] Out: -   Labs:  Recent Labs  09/04/15 1907 09/07/15 0850  WBC 3.8* 6.4  HGB 11.0* 11.5*  PLT 161 162  CREATININE 1.20* 1.02*   Estimated Creatinine Clearance: 54.7 mL/min (by C-G formula based on Cr of 1.02). No results for input(s): VANCOTROUGH, VANCOPEAK, VANCORANDOM, GENTTROUGH, GENTPEAK, GENTRANDOM, TOBRATROUGH, TOBRAPEAK, TOBRARND, AMIKACINPEAK, AMIKACINTROU, AMIKACIN in the last 72 hours.   Microbiology: No results found for this or any previous visit (from the past 720 hour(s)).  Medical History: Past Medical History  Diagnosis Date  . Hypertension   . Pancreatitis     x3  . Depression   . Alcohol dependence (HCC) 09/07/2015  . Macrocytic anemia 09/07/2015  . Cirrhosis of liver [facility](HCC): Per CT 09/07/2015 09/07/2015  . GAD (generalized anxiety disorder) 09/07/2015  . DVT (deep venous thrombosis) (HCC): hX OF MULTIPLE DVT PER PATIENT 09/07/2015  . Hyperlipidemia 09/07/2015  . GERD (gastroesophageal reflux disease) 09/07/2015   Assessment: Patient is a 64 y.o F [facility] with c/o SOB.  She was found to have elevated LA and Chest CT showed severe multilobe PNA.  To start broad abx with vancomycin and zosyn for sepsis/PNA.  Of note, patient received zosyn 3.375gm IV at 1130 and vancomycin 1gm at noon in the ED   Goal of Therapy:  Vancomycin trough level 15-20 mcg/ml  Plan:  - vancomycin 1gm IV q12h - zosyn 3.375 gm IV q8h (over 4 hours)   Danaka Llera  P 09/07/2015,2:11 PM

## 2015-09-07 NOTE — ED Notes (Signed)
MD at bedside. 

## 2015-09-07 NOTE — ED Provider Notes (Signed)
CSN: [id number]     Arrival date & time 09/07/15  [id number] History   First MD Initiated Contact with Patient 09/07/15 0825     Chief Complaint  Patient presents with  . Shortness of Breath     (Consider location/radiation/quality/duration/timing/severity/associated sxs/prior Treatment) Patient is a 64 y.o. female presenting with shortness of breath. The history is provided by the patient (Patient complains of shortness of breath and mild cough and weakness).  Shortness of Breath Severity:  Moderate Onset quality:  Gradual Timing:  Constant Progression:  Worsening Chronicity:  New Context: activity   Associated symptoms: no abdominal pain, no chest pain, no cough, no headaches and no rash     Past Medical History  Diagnosis Date  . Hypertension   . Pancreatitis     x3  . Depression    History reviewed. No pertinent past surgical history. No family history on file. Social History  Substance Use Topics  . Smoking status: Never Smoker   . Smokeless tobacco: None  . Alcohol Use: Yes     Comment: 1/2 pint a day of vodka   OB History    No data available     Review of Systems  Constitutional: Negative for appetite change and fatigue.  HENT: Negative for congestion, ear discharge and sinus pressure.   Eyes: Negative for discharge.  Respiratory: Positive for shortness of breath. Negative for cough.   Cardiovascular: Negative for chest pain.  Gastrointestinal: Negative for abdominal pain and diarrhea.  Genitourinary: Negative for frequency and hematuria.  Musculoskeletal: Negative for back pain.  Skin: Negative for rash.  Neurological: Negative for seizures and headaches.  Psychiatric/Behavioral: Negative for hallucinations.      Allergies  Review of patient's allergies indicates no known allergies.  Home Medications   Prior to Admission medications   Medication Sig Start Date End Date Taking? Authorizing Provider  alum & mag hydroxide-simeth (MAALOX/MYLANTA)  200-200-20 MG/5ML suspension Take 30 mLs by mouth every 6 (six) hours as needed for indigestion or heartburn.   Yes Historical Provider, MD  anti-nausea (EMETROL) solution Take 5-10 mLs by mouth every 15 (fifteen) minutes as needed for nausea or vomiting.   Yes Historical Provider, MD  benzocaine-menthol (CHLORASEPTIC) 6-10 MG lozenge Take 1 lozenge by mouth as needed for sore throat.   Yes Historical Provider, MD  benzonatate (TESSALON) 100 MG capsule Take 100 mg by mouth 3 (three) times daily as needed for cough.   Yes Historical Provider, MD  busPIRone (BUSPAR) 15 MG tablet Take 15 mg by mouth 3 (three) times daily.   Yes Historical Provider, MD  Calcium Citrate-Vitamin D (CALCIUM + D PO) Take 1 tablet by mouth daily.   Yes Historical Provider, MD  cetirizine (ZYRTEC) 10 MG tablet Take 10 mg by mouth daily as needed for allergies.   Yes Historical Provider, MD  chlordiazePOXIDE (LIBRIUM) 5 MG capsule Take 5 mg by mouth 4 (four) times daily. For 4 doses.Discontinued 08/06/2015   Yes Historical Provider, MD  Cholecalciferol (VITAMIN D PO) Take 1 capsule by mouth daily.   Yes Historical Provider, MD  dabigatran (PRADAXA) 150 MG CAPS capsule Take 150 mg by mouth 2 (two) times daily.   Yes Historical Provider, MD  diazepam (VALIUM) 5 MG/ML injection Inject 10 mg into the vein as directed. Stat at onset of seizures   Yes Historical Provider, MD  dicyclomine (BENTYL) 20 MG tablet Take 20 mg by mouth 2 (two) times daily as needed for spasms.   Yes Historical Provider, MD  DULoxetine (CYMBALTA) 60 MG capsule Take 60 mg by mouth daily.   Yes Historical Provider, MD  gabapentin (NEURONTIN) 600 MG tablet Take 600 mg by mouth 3 (three) times daily.   Yes Historical Provider, MD  guaiFENesin (MUCINEX) 600 MG 12 hr tablet Take 600 mg by mouth 2 (two) times daily.   Yes Historical Provider, MD  ibuprofen (ADVIL,MOTRIN) 600 MG tablet Take 600 mg by mouth 4 (four) times daily as needed for moderate pain.   Yes  Historical Provider, MD  lidocaine (LIDODERM) 5 % Place 1 patch onto the skin daily. Remove & Discard patch within 12 hours or as directed by MD   Yes Historical Provider, MD  lisinopril (PRINIVIL,ZESTRIL) 20 MG tablet Take 20 mg by mouth daily.   Yes Historical Provider, MD  loperamide (IMODIUM A-D) 2 MG tablet Take 2 mg by mouth 4 (four) times daily as needed for diarrhea or loose stools.   Yes Historical Provider, MD  magnesium oxide (MAG-OX) 400 MG tablet Take 400 mg by mouth 3 (three) times daily.   Yes Historical Provider, MD  methocarbamol (ROBAXIN) 500 MG tablet Take 500 mg by mouth every 8 (eight) hours as needed for muscle spasms.   Yes Historical Provider, MD  Multiple Vitamin (MULTIVITAMIN WITH MINERALS) TABS tablet Take 1 tablet by mouth daily.   Yes Historical Provider, MD  ondansetron (ZOFRAN) 8 MG tablet Take 8 mg by mouth 2 (two) times daily as needed for nausea or vomiting.   Yes Historical Provider, MD  pantoprazole (PROTONIX) 40 MG tablet Take 40 mg by mouth daily.   Yes Historical Provider, MD  thiamine 100 MG tablet Take 100 mg by mouth daily.   Yes Historical Provider, MD  traZODone (DESYREL) 50 MG tablet Take 50 mg by mouth at bedtime.   Yes Historical Provider, MD   BP 140/74 mmHg  Pulse 114  Temp(Src) 99.3 F (37.4 C) (Axillary)  Resp 18  SpO2 96% Physical Exam  Constitutional: She is oriented to person, place, and time. She appears well-developed.  HENT:  Head: Normocephalic.  Eyes: Conjunctivae and EOM are normal. No scleral icterus.  Neck: Neck supple. No thyromegaly present.  Cardiovascular: Normal rate and regular rhythm.  Exam reveals no gallop and no friction rub.   No murmur heard. Pulmonary/Chest: No stridor. She has wheezes. She has no rales. She exhibits no tenderness.  Abdominal: She exhibits no distension. There is no tenderness. There is no rebound.  Musculoskeletal: Normal range of motion. She exhibits no edema.  Lymphadenopathy:    She has no  cervical adenopathy.  Neurological: She is oriented to person, place, and time. She exhibits normal muscle tone. Coordination normal.  Skin: No rash noted. No erythema.  Psychiatric: She has a normal mood and affect. Her behavior is normal.    ED Course  Procedures (including critical care time) Labs Review Labs Reviewed  CBC WITH DIFFERENTIAL/PLATELET - Abnormal; Notable for the following:    RBC 3.05 (*)    Hemoglobin 11.5 (*)    HCT 34.1 (*)    MCV 111.8 (*)    MCH 37.7 (*)    All other components within normal limits  COMPREHENSIVE METABOLIC PANEL - Abnormal; Notable for the following:    Potassium 3.2 (*)    Chloride 100 (*)    Glucose, Bld 243 (*)    BUN 25 (*)    Creatinine, Ser 1.02 (*)    Albumin 3.4 (*)    AST 48 (*)  GFR calc non Af Amer 57 (*)    All other components within normal limits  BRAIN NATRIURETIC PEPTIDE - Abnormal; Notable for the following:    B Natriuretic Peptide 191.0 (*)    All other components within normal limits  TROPONIN I - Abnormal; Notable for the following:    Troponin I 0.24 (*)    All other components within normal limits  LIPASE, BLOOD - Abnormal; Notable for the following:    Lipase 57 (*)    All other components within normal limits  BLOOD GAS, ARTERIAL - Abnormal; Notable for the following:    pH, Arterial 7.340 (*)    pO2, Arterial 75.1 (*)    All other components within normal limits  I-STAT TROPOININ, ED - Abnormal; Notable for the following:    Troponin i, poc 0.24 (*)    All other components within normal limits  I-STAT CG4 LACTIC ACID, ED - Abnormal; Notable for the following:    Lactic Acid, Venous 2.82 (*)    All other components within normal limits  CULTURE, BLOOD (ROUTINE X 2)  CULTURE, BLOOD (ROUTINE X 2)  ETHANOL  MAGNESIUM  I-STAT CG4 LACTIC ACID, ED  Rosezena Sensor, ED    Imaging Review Dg Chest 2 View  09/07/2015  CLINICAL DATA:  Weakness, dizziness, shortness of breath and cough EXAM: CHEST  2 VIEW  COMPARISON:  None. FINDINGS: Heart size is normal. No pleural effusion identified. Asymmetric elevation of right hemidiaphragm identified. There are bilateral, upper lobe predominant pulmonary opacities left greater than right. There may be a cavitary process involving the left upper lobe. The visualized osseous structures appear intact. IMPRESSION: 1. Bilateral, upper lobe predominant airspace opacities with possible cavitary process involving the left upper lobe. Recommend further evaluation with CT of the chest. Electronically Signed   By: Signa Kell M.D.   On: 09/07/2015 10:05   Ct Chest W Contrast  09/07/2015  CLINICAL DATA:  64 year old female with a history of drug abuse. Difficulty breathing this morning. Hypoxemia. EXAM: CT CHEST WITH CONTRAST TECHNIQUE: Multidetector CT imaging of the chest was performed during intravenous contrast administration. CONTRAST:  75mL OMNIPAQUE IOHEXOL 300 MG/ML  SOLN COMPARISON:  No priors. FINDINGS: Mediastinum/Lymph Nodes: Heart size is mildly enlarged. There is no significant pericardial fluid, thickening or pericardial calcification. No pathologically enlarged mediastinal or hilar lymph nodes. Small hiatal hernia. No axillary lymphadenopathy. Lungs/Pleura: Widespread peribronchovascular ground-glass attenuation and thickening of the peribronchovascular interstitium with extensive architectural distortion throughout the lungs bilaterally. No craniocaudal gradient. No pleural effusions. Study is limited by extensive respiratory motion. Upper Abdomen: Liver has a shrunken appearance and nodular contour, compatible with underlying cirrhosis. Musculoskeletal/Soft Tissues: There are no aggressive appearing lytic or blastic lesions noted in the visualized portions of the skeleton. Compression fracture of superior endplate of T11 with approximately 15% loss of anterior vertebral body height, likely chronic. IMPRESSION: 1. Severe multilobar pneumonia, as above. 2. Mild  cardiomegaly. 3. Morphologic changes in the liver compatible with cirrhosis. 4. Small hiatal hernia. Electronically Signed   By: Trudie Reed M.D.   On: 09/07/2015 12:33   I have personally reviewed and evaluated these images and lab results as part of my medical decision-making.   EKG Interpretation   Date/Time:  Sunday September 07 2015 09:05:59 EST Ventricular Rate:  128 PR Interval:  118 QRS Duration: 83 QT Interval:  374 QTC Calculation: 546 R Axis:   21 Text Interpretation:  Sinus tachycardia Inferior infarct, old Probable  anteroseptal infarct, old Prolonged  QT interval Confirmed by Uchenna Rappaport  MD,  Kyelle Urbas [phone]) on 09/07/2015 9:34:15 AM     CRITICAL CARE Performed by: Mell Mellott L Total critical care time: 40 minutes Critical care time was exclusive of separately billable procedures and treating other patients. Critical care was necessary to treat or prevent imminent or life-threatening deterioration. Critical care was time spent personally by me on the following activities: development of treatment plan with patient and/or surrogate as well as nursing, discussions with consultants, evaluation of patient's response to treatment, examination of patient, obtaining history from patient or surrogate, ordering and performing treatments and interventions, ordering and review of laboratory studies, ordering and review of radiographic studies, pulse oximetry and re-evaluation of patient's condition.   MDM   Final diagnoses:  Community acquired pneumonia      Patient is admitted to stepdown for pneumonia. She's had blood cultures done and Zosyn and vancomycin started  Bethann Berkshire, MD 09/07/15 1309

## 2015-09-07 NOTE — H&P (Signed)
[facility] History and Physical  Abigail Santiago HWE:[id number] DOB: May 07, 1951 DOA: 09/07/2015  Referring physician: Dr. Dewayne Hatch PCP: Tillie Fantasia, MD   Chief Complaint: Shortness of breath  HPI: Abigail Santiago is a 64 y.o. 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 Patient stated she felt so bad that she fell asleep and to lectures. Patient denied any chest pain, no diarrhea, no constipation, no dysuria, no hematemesis, no hematochezia, no melena. EMS was called and per ED notes upon arrival patient's O2 sats was 60-70% and patient was placed on 3 L nasal cannula with an increase in the sats to 92%. Patient was noted to be wheezing at the time and given an albuterol nebulizer and dual nebs as well as Solu-Medrol 125 mg IV push. Patient was seen in the ED, patient was noted to be tachycardic with heart rates as high as 129 with a temp of 100.9 respiratory rate of up to 31 and initial systolic blood pressure 893/81. ABG obtained had a pH of 7.34 PCO2 of 44 PO2 of 75 otherwise was within normal limits. Lactic acid level was elevated at 2.94. INR was 1.22., Troponin was 0.24. CBC had a hemoglobin of 11.5 with a MCV of 111.8 otherwise was within normal limits. Comprehensive metabolic profile at a potassium of 3.2 chloride of 100 BUN of 25 creatinine of 1.02 glucose of 243 albumin of 3.4 AST of 48 otherwise was within normal limits. Chest x-ray does show bilateral upper lobe airspace opacities with possible cavitary  process involving the left upper lobe. CT of the chest which was done showed severe multilobar pneumonia, mild cardiomegaly, morphological changes in the liver compatible with cirrhosis. Patient was given IV vancomycin and IV Zosyn as she did recently been hospitalized at [facility] within the past 3 months. [facility] will call to admit the patient for further evaluation and management.   Review of Systems: As per history of present illness otherwise negative. Constitutional:  No weight loss, night sweats, Fevers, chills, fatigue.  HEENT:  No headaches, Difficulty swallowing,Tooth/dental problems,Sore throat,  No sneezing, itching, ear ache, nasal congestion, post nasal drip,  Cardio-vascular:  No chest pain, Orthopnea, PND, swelling in lower extremities, anasarca, dizziness, palpitations  GI:  No heartburn, indigestion, abdominal pain, nausea, vomiting, diarrhea, change in bowel habits, loss of appetite  Resp:  No shortness of breath with exertion or at rest. No excess mucus, no productive cough, No non-productive cough, No coughing up of blood.No change in color of mucus.No wheezing.No chest wall deformity  Skin:  no rash or lesions.  GU:  no dysuria, change in color of urine, no urgency or frequency. No flank pain.  Musculoskeletal:  No joint pain or swelling. No decreased range of motion. No back pain.  Psych:  No change in mood or affect. No depression or anxiety. No memory loss.   Past Medical History  Diagnosis Date  . Hypertension   . Pancreatitis     x3  . Depression   . Alcohol dependence ([place]) 09/07/2015  . Macrocytic anemia 09/07/2015  . Cirrhosis of liver [facility]): Per CT 09/07/2015 09/07/2015  . GAD (generalized anxiety  disorder) 09/07/2015  . DVT (deep venous thrombosis) ([place]): hX OF MULTIPLE DVT PER PATIENT 09/07/2015  . Hyperlipidemia 09/07/2015  . GERD (gastroesophageal reflux disease) 09/07/2015   History reviewed. No pertinent past surgical history. Social  History:  reports that she has never smoked. She does not have any smokeless tobacco history on file. She reports that she drinks alcohol. She reports that she does not use illicit drugs.  No Known Allergies  Family History  Problem Relation Age of Onset  . Lymphoma Mother   . Pneumonia Father   . Failure to thrive Father   . Rheum arthritis Father    mother alive age 65 with history of lymphoma. Father deceased age 44 secondary to complications from aspiration pneumonia, failure to thrive, rheumatoid arthritis. Patient is currently widowed.  Prior to Admission medications   Medication Sig Start Date End Date Taking? Authorizing Provider  alum & mag hydroxide-simeth (MAALOX/MYLANTA) 200-200-20 MG/5ML suspension Take 30 mLs by mouth every 6 (six) hours as needed for indigestion or heartburn.   Yes Historical Provider, MD  anti-nausea (EMETROL) solution Take 5-10 mLs by mouth every 15 (fifteen) minutes as needed for nausea or vomiting.   Yes Historical Provider, MD  benzocaine-menthol (CHLORASEPTIC) 6-10 MG lozenge Take 1 lozenge by mouth as needed for sore throat.   Yes Historical Provider, MD  benzonatate (TESSALON) 100 MG capsule Take 100 mg by mouth 3 (three) times daily as needed for cough.   Yes Historical Provider, MD  busPIRone (BUSPAR) 15 MG tablet Take 15 mg by mouth 3 (three) times daily.   Yes Historical Provider, MD  Calcium Citrate-Vitamin D (CALCIUM + D PO) Take 1 tablet by mouth daily.   Yes Historical Provider, MD  cetirizine (ZYRTEC) 10 MG tablet Take 10 mg by mouth daily as needed for allergies.   Yes Historical Provider, MD  chlordiazePOXIDE (LIBRIUM) 5 MG capsule Take 5 mg by mouth 4 (four) times daily. For 4 doses.Discontinued 08/06/2015   Yes Historical Provider, MD  Cholecalciferol (VITAMIN D PO) Take 1 capsule by mouth daily.   Yes Historical Provider, MD  dabigatran (PRADAXA) 150 MG CAPS capsule Take 150 mg by mouth 2 (two) times daily.   Yes Historical Provider, MD    diazepam (VALIUM) 5 MG/ML injection Inject 10 mg into the vein as directed. Stat at onset of seizures   Yes Historical Provider, MD  dicyclomine (BENTYL) 20 MG tablet Take 20 mg by mouth 2 (two) times daily as needed for spasms.   Yes Historical Provider, MD  DULoxetine (CYMBALTA) 60 MG capsule Take 60 mg by mouth daily.   Yes Historical Provider, MD  gabapentin (NEURONTIN) 600 MG tablet Take 600 mg by mouth 3 (three) times daily.   Yes Historical Provider, MD  guaiFENesin (MUCINEX) 600 MG 12 hr tablet Take 600 mg by mouth 2 (two) times daily.   Yes Historical Provider, MD  ibuprofen (ADVIL,MOTRIN) 600 MG tablet Take 600 mg by mouth 4 (four) times daily as needed for moderate pain.   Yes Historical Provider, MD  lidocaine (LIDODERM) 5 % Place 1 patch onto the skin daily. Remove & Discard patch within 12 hours or as directed by MD   Yes Historical Provider, MD  lisinopril (PRINIVIL,ZESTRIL) 20 MG tablet Take 20 mg by mouth daily.   Yes Historical Provider, MD  loperamide (IMODIUM A-D) 2 MG tablet Take 2 mg by mouth 4 (four) times daily as needed for diarrhea or loose stools.   Yes Historical Provider, MD  magnesium  oxide (MAG-OX) 400 MG tablet Take 400 mg by mouth 3 (three) times daily.   Yes Historical Provider, MD  methocarbamol (ROBAXIN) 500 MG tablet Take 500 mg by mouth every 8 (eight) hours as needed for muscle spasms.   Yes Historical Provider, MD  Multiple Vitamin (MULTIVITAMIN WITH MINERALS) TABS tablet Take 1 tablet by mouth daily.   Yes Historical Provider, MD  ondansetron (ZOFRAN) 8 MG tablet Take 8 mg by mouth 2 (two) times daily as needed for nausea or vomiting.   Yes Historical Provider, MD  pantoprazole (PROTONIX) 40 MG tablet Take 40 mg by mouth daily.   Yes Historical Provider, MD  thiamine 100 MG tablet Take 100 mg by mouth daily.   Yes Historical Provider, MD  traZODone (DESYREL) 50 MG tablet Take 50 mg by mouth at bedtime.   Yes Historical Provider, MD   Physical Exam: Filed  Vitals:   09/07/15 1200 09/07/15 1300 09/07/15 1330 09/07/15 1346  BP: 140/74 134/78 116/79   Pulse: 114 111 107   Temp:      TempSrc:      Resp: 18 31 24    Height:    5' 3"  (1.6 m)  Weight:    74.844 kg (165 lb)  SpO2: 96% 95% 96%     Wt Readings from Last 3 Encounters:  09/07/15 74.844 kg (165 lb)  09/02/15 74.844 kg (165 lb)    General:  Well-developed well-nourished laying on gurney speaking in chopped sentences.  Eyes: PERRLA, EOMI, normal lids, irises & conjunctiva ENT: grossly normal hearing, lips & tongue. Dry mucous membranes. Neck: no LAD, masses or thyromegaly Cardiovascular: Tachycardic, no m/r/g. No LE edema. Telemetry: Sinus tachycardia Respiratory: Diffuse coarse rhonchorous breath sounds. No wheezing noted. Fair to poor air movement. Abdomen: soft, ntnd, positive bowel sounds, no rebound, no guarding Skin: no rash or induration seen on limited exam Musculoskeletal: grossly normal tone BUE/BLE Psychiatric: grossly normal mood and affect, speech fluent and appropriate Neurologic: Alert and oriented 3. Cranial nerves II through XII grossly intact. Visual fields are intact. Sensation is intact. No focal abnormalities.           Labs on Admission:  Basic Metabolic Panel:  Recent Labs Lab 09/02/15 1058 09/04/15 1907 09/07/15 0850  NA 143 139 141  K 4.6 4.5 3.2*  CL 100* 99* 100*  CO2 28 31 30   GLUCOSE 109* 85 243*  BUN 23* 21* 25*  CREATININE 0.87 1.20* 1.02*  CALCIUM 9.4 9.6 9.0   Liver Function Tests:  Recent Labs Lab 09/02/15 1058 09/04/15 1907 09/07/15 0850  AST 170* 72* 48*  ALT 55* 40 35  ALKPHOS 112 99 86  BILITOT 0.5 0.4 0.6  PROT 7.8 6.1* 6.5  ALBUMIN 4.5 3.5 3.4*    Recent Labs Lab 09/02/15 1058 09/07/15 0900  LIPASE 137* 57*   No results for input(s): AMMONIA in the last 168 hours. CBC:  Recent Labs Lab 09/02/15 1058 09/04/15 1907 09/07/15 0850  WBC 6.1 3.8* 6.4  NEUTROABS 3.6 1.8 5.0  HGB 12.8 11.0* 11.5*  HCT  39.2 33.5* 34.1*  MCV 113.3* 113.9* 111.8*  PLT 218 161 162   Cardiac Enzymes:  Recent Labs Lab 09/07/15 0908  TROPONINI 0.24*    BNP (last 3 results)  Recent Labs  09/07/15 0850  BNP 191.0*    ProBNP (last 3 results) No results for input(s): PROBNP in the last 8760 hours.  CBG: No results for input(s): GLUCAP in the last 168 hours.  Radiological Exams on  Admission: Dg Chest 2 View  09/07/2015  CLINICAL DATA:  Weakness, dizziness, shortness of breath and cough EXAM: CHEST  2 VIEW COMPARISON:  None. FINDINGS: Heart size is normal. No pleural effusion identified. Asymmetric elevation of right hemidiaphragm identified. There are bilateral, upper lobe predominant pulmonary opacities left greater than right. There may be a cavitary process involving the left upper lobe. The visualized osseous structures appear intact. IMPRESSION: 1. Bilateral, upper lobe predominant airspace opacities with possible cavitary process involving the left upper lobe. Recommend further evaluation with CT of the chest. Electronically Signed   By: Kerby Moors M.D.   On: 09/07/2015 10:05   Ct Chest W Contrast  09/07/2015  CLINICAL DATA:  64 year old female with a history of drug abuse. Difficulty breathing this morning. Hypoxemia. EXAM: CT CHEST WITH CONTRAST TECHNIQUE: Multidetector CT imaging of the chest was performed during intravenous contrast administration. CONTRAST:  5m OMNIPAQUE IOHEXOL 300 MG/ML  SOLN COMPARISON:  No priors. FINDINGS: Mediastinum/Lymph Nodes: Heart size is mildly enlarged. There is no significant pericardial fluid, thickening or pericardial calcification. No pathologically enlarged mediastinal or hilar lymph nodes. Small hiatal hernia. No axillary lymphadenopathy. Lungs/Pleura: Widespread peribronchovascular ground-glass attenuation and thickening of the peribronchovascular interstitium with extensive architectural distortion throughout the lungs bilaterally. No craniocaudal gradient.  No pleural effusions. Study is limited by extensive respiratory motion. Upper Abdomen: Liver has a shrunken appearance and nodular contour, compatible with underlying cirrhosis. Musculoskeletal/Soft Tissues: There are no aggressive appearing lytic or blastic lesions noted in the visualized portions of the skeleton. Compression fracture of superior endplate of TF02with approximately 15% loss of anterior vertebral body height, likely chronic. IMPRESSION: 1. Severe multilobar pneumonia, as above. 2. Mild cardiomegaly. 3. Morphologic changes in the liver compatible with cirrhosis. 4. Small hiatal hernia. Electronically Signed   By: DVinnie LangtonM.D.   On: 09/07/2015 12:33    EKG: Independently reviewed. Sinus tachycardia with Q waves in leads 3, AVF.  Assessment/Plan Principal Problem:   HCAP (healthcare-associated pneumonia) Active Problems:   Sepsis, unspecified organism ([place]   Al[place] dependence ([place]   Hypokalemia   Elevated troponin   Macrocytic anemia   [place]osis of liver ([place]: Per CT 09/07/2015   Depression   GAD (generalized anxiety disorder)   DVT (deep [place] thrombosis) ([place]: hX OF MULTIPLE DVT PER PATIENT   Hyperlipidemia   GERD (gastroesophageal reflux disease)  #1 multilobar healthcare associated pneumonia Patient presented with a fever, shortness of breath, coarse breath sounds with diffuse rhonchi, tachycardic with elevated lactic acid level and chest x-ray and CT findings concerning for multilobar pneumonia. Admit the patient to stepdown unit. Check a urine Legionella antigen. Check a urine pneumococcus antigen. Check blood cultures 2. Check a sputum Gram stain and culture. Check a lactic acid level. Check a pro calcitonin level. Place empirically on IV vancomycin, IV Zosyn, scheduled nebulizers, oxygen, Mucinex.  #2 sepsis likely secondary to healthcare associated pneumonia Patient on admission met criteria for sepsis with tachycardia, tachypnea, borderline blood  pressure, was hypoxic when EMS arrived to pick her up with sats in the 60s to 70s. Lactic acid was elevated. Chest x-ray CT scan consistent with multilobar pneumonia. Repeat lactic acid levels. Check a pro-calcitonin. Place on the sepsis protocol. Panculture. Will place empirically on IV vancomycin and IV Zosyn. Follow.  #3 hypokalemia Replete. Check a magnesium level.   #4 elevated troponin Likely a demand ischemia secondary to sepsis. Will cycle serial cardiac enzymes. Check a 2-D echo. If cardiac enzymes continue to remain elevated  or 2-D echo is abnormal will consult with cardiology for further evaluation and management.  #5 macrocytic anemia Likely secondary to alcohol dependence. Check an anemia panel. Place on thiamine and folic acid. Follow H&H.  #6 history of alcohol dependence Will place on the Ativan withdrawal protocol. Thiamine, multivitamin, folic acid. We'll give a banana bag 1.  #7 gastroesophageal reflux disease PPI.  #8 history of recurrent DVTs per patient Continue home regimen of ataxia.  #9 hyperlipidemia Check a fasting lipid panel.  #10 depression/general anxiety disorder Stable. Continue home regimen of Cymbalta. BuSpar.  #11 cirrhosis of the liver per CT scan Will likely need outpatient follow-up.  #12 prophylaxis PPI for GI prophylaxis. Pradaxa for DVT prophylaxis.   Code Status: Full DVT Prophylaxis: Pradaxa Family Communication: Updated patient. No family at bedside. Disposition Plan: Admit to SDU.  Time spent: [facility] Pager [phone]

## 2015-09-07 NOTE — ED Notes (Signed)
Per [facility]- [facility]. States she has felt bab for several days however increased trouble breathing this AM. Upon arrival 02 sats 60-70% placed on 3LNC increased her to 92%. Lung fields wheezy. NEB albulterol and DUO NEB treatments and solmedrol 125mg  IVP given. Breathing improved.

## 2015-09-07 NOTE — ED Notes (Signed)
Morrie SheldonAshley, RN made aware of  Mag 0.9.

## 2015-09-07 NOTE — ED Notes (Signed)
Bed: WA09 Expected date: 09/07/15 Expected time: 8:09 AM Means of arrival: Ambulance Comments: Diff breathing sats 80's 100% on breathing treatment. Tachy 130's

## 2015-09-08 ENCOUNTER — Inpatient Hospital Stay (HOSPITAL_COMMUNITY): Payer: No Typology Code available for payment source

## 2015-09-08 ENCOUNTER — Encounter (HOSPITAL_COMMUNITY): Payer: Self-pay | Admitting: Internal Medicine

## 2015-09-08 DIAGNOSIS — D509 Iron deficiency anemia, unspecified: Secondary | ICD-10-CM

## 2015-09-08 DIAGNOSIS — R7989 Other specified abnormal findings of blood chemistry: Secondary | ICD-10-CM | POA: Diagnosis present

## 2015-09-08 DIAGNOSIS — R0600 Dyspnea, unspecified: Secondary | ICD-10-CM

## 2015-09-08 HISTORY — DX: Iron deficiency anemia, unspecified: D50.9

## 2015-09-08 LAB — CBC WITH DIFFERENTIAL/PLATELET
BASOS ABS: 0 10*3/uL (ref 0.0–0.1)
Basophils Relative: 0 %
EOS ABS: 0 10*3/uL (ref 0.0–0.7)
Eosinophils Relative: 0 %
HCT: 30 % — ABNORMAL LOW (ref 36.0–46.0)
HEMOGLOBIN: 9.9 g/dL — AB (ref 12.0–15.0)
LYMPHS PCT: 7 %
Lymphs Abs: 0.6 10*3/uL — ABNORMAL LOW (ref 0.7–4.0)
MCH: 38.1 pg — ABNORMAL HIGH (ref 26.0–34.0)
MCHC: 33 g/dL (ref 30.0–36.0)
MCV: 115.4 fL — ABNORMAL HIGH (ref 78.0–100.0)
Monocytes Absolute: 1.2 10*3/uL — ABNORMAL HIGH (ref 0.1–1.0)
Monocytes Relative: 13 %
NEUTROS PCT: 80 %
Neutro Abs: 7.4 10*3/uL (ref 1.7–7.7)
Platelets: 144 10*3/uL — ABNORMAL LOW (ref 150–400)
RBC: 2.6 MIL/uL — AB (ref 3.87–5.11)
RDW: 14.8 % (ref 11.5–15.5)
WBC: 9.2 10*3/uL (ref 4.0–10.5)

## 2015-09-08 LAB — BASIC METABOLIC PANEL
Anion gap: 6 (ref 5–15)
BUN: 21 mg/dL — AB (ref 6–20)
CHLORIDE: 107 mmol/L (ref 101–111)
CO2: 26 mmol/L (ref 22–32)
CREATININE: 1.02 mg/dL — AB (ref 0.44–1.00)
Calcium: 8.6 mg/dL — ABNORMAL LOW (ref 8.9–10.3)
GFR calc non Af Amer: 57 mL/min — ABNORMAL LOW (ref >60)
Glucose, Bld: 193 mg/dL — ABNORMAL HIGH (ref 65–99)
POTASSIUM: 4.6 mmol/L (ref 3.5–5.1)
Sodium: 139 mmol/L (ref 135–145)

## 2015-09-08 LAB — HIV ANTIBODY (ROUTINE TESTING W REFLEX): HIV Screen 4th Generation wRfx: NONREACTIVE

## 2015-09-08 LAB — URINE CULTURE: Culture: NO GROWTH

## 2015-09-08 LAB — POCT I-STAT TROPONIN I: TROPONIN I, POC: 0.18 ng/mL — AB (ref 0.00–0.08)

## 2015-09-08 LAB — LIPID PANEL
CHOL/HDL RATIO: 1.4 ratio
CHOLESTEROL: 121 mg/dL (ref 0–200)
HDL: 84 mg/dL (ref >40)
LDL Cholesterol: 30 mg/dL (ref 0–99)
TRIGLYCERIDES: 36 mg/dL (ref <150)
VLDL: 7 mg/dL (ref 0–40)

## 2015-09-08 LAB — LEGIONELLA PNEUMOPHILA SEROGP 1 UR AG: L. PNEUMOPHILA SEROGP 1 UR AG: NEGATIVE

## 2015-09-08 LAB — T4, FREE: Free T4: 0.79 ng/dL (ref 0.61–1.12)

## 2015-09-08 LAB — TROPONIN I: Troponin I: 0.06 ng/mL — ABNORMAL HIGH (ref <0.031)

## 2015-09-08 LAB — MAGNESIUM: Magnesium: 2.1 mg/dL (ref 1.7–2.4)

## 2015-09-08 LAB — ABO/RH: ABO/RH(D): O POS

## 2015-09-08 MED ORDER — SODIUM CHLORIDE 0.9 % IV SOLN
510.0000 mg | Freq: Once | INTRAVENOUS | Status: AC
  Administered 2015-09-08: 510 mg via INTRAVENOUS
  Filled 2015-09-08: qty 17

## 2015-09-08 NOTE — Progress Notes (Signed)
[facility] PROGRESS NOTE  Abigail Santiago ZOX:[id number] DOB: [birth year] DOA: 09/07/2015 PCP: Tillie Fantasia, MD  Assessment/Plan: #1 multilobar healthcare associated pneumonia Patient had presented with fevers, shortness of breath, clinical findings consistent with a pneumonia, elevated lactic acid level, chest x-ray and CT findings concerning for multilobar pneumonia. Patient with some clinical improvement. Blood cultures pending. Sputum Gram stain and culture pending. Lactic acid level has trended down. Urine streptococcus antigen is negative. Urine Legionella antigen is pending. Continue empiric Vancomycin IV Zosyn. Continue oxygen, Mucinex, nebulizers. Follow.  #2 sepsis likely secondary to healthcare associated pneumonia Patient met criteria for sepsis with tachycardia, tachypnea, borderline blood pressure, hypoxia when patient was first picked up by EMS with sats in the 60s to 70s. Lactic acid was also elevated. Hypoxia has improved. Lactic acid trended down. Chest x-ray CT scan consistent with a multilobar pneumonia. Patient has been pancultured and results pending. Urinalysis is nitrite negative leukocytes negative. Continue IV fluids, empiric IV antibiotics, oxygen, Mucinex, nebulizer treatments. Follow.  #3 hypokalemia/hypomagnesemia Repleted.  #4 elevated troponin Likely secondary to sepsis and current infection. Cardiac enzymes trending down. 2-D echo pending. Follow.  #5 iron deficiency anemia/microcytic anemia Patient with no overt bleeding. Anemia panel consistent with iron deficiency anemia with iron level of 8 and a ferritin of 153 with a folate of 31, B-12 = 304.Hemoglobin currently at 9.9 from 11.5 on admission. Partial dilutional effect. Will place on IV iron. Will likely need to be on oral iron supplementation on discharge.  #6 history of DVTs Pradaxa.  #7 gastroesophageal reflux disease PPI.  #8 history of alcohol dependence Patient was at [name] for  alcohol rehabilitation/detox prior to admission. Continue the Ativan withdrawal protocol. Continue thiamine, multivitamin, folic acid. Will likely need to go back to [facility] on discharge.   #9?? Hyperlipidemia Fasting lipid panel not indicative of  hyperlipidemia.Total cholesterol is 121. HDL is 84. LDL is 30. No need for any medications at this time.  #10   depression/general anxiety disorder Stable. Continue Cymbalta and BuSpar.  #11 cirrhosis of the liver per CT scan Will likely need outpatient follow-up. [facility].  #12 abnormal TSH Check a free T4. Check a T3.  #13 prophylaxis PPI for GI prophylaxis. Pradaxa for DVT prophylaxis.    Code Status: Full Family Communication: Updated patient. No family at bedside. Disposition Plan: Remain in the step down unit.   Consultants:  None  Procedures:  Chest x-ray 09/07/2015  CT chest 09/07/2015  Antibiotics:  IV vancomycin 09/07/2015  IV Zosyn 09/07/2015  HPI/Subjective: Patient states some improvement with her breathing since admission. No chest pain. Eating breakfast.  Objective: Filed Vitals:   09/08/15 0800  BP: 120/67  Pulse: 90  Temp: 97.7 F (36.5 C)  Resp: 21    Intake/Output Summary (Last 24 hours) at 09/08/15 0841 Last data filed at 09/08/15 0800  Gross per 24 hour  Intake 8221.66 ml  Output    790 ml  Net 7431.66 ml   Filed Weights   09/07/15 1346 09/07/15 1448 09/08/15 0600  Weight: 74.844 kg (165 lb) 77.6 kg (171 lb 1.2 oz) 79.7 kg (175 lb 11.3 oz)    Exam:   General:  NAD. Tremulousness  Cardiovascular: RRR  Respiratory: Diffuse coarse breath sounds with rhonchi.  Abdomen: Soft, nontender, nondistended, positive bowel sounds.  Musculoskeletal: No clubbing cyanosis or edema.  Data Reviewed: Basic Metabolic Panel:  Recent Labs Lab 09/02/15 1058 09/04/15 1907 09/07/15 0850 09/07/15 0900 09/07/15 1600 09/08/15  0346  NA  143 139 141  --   --  139  K 4.6 4.5 3.2*  --   --  4.6  CL 100* 99* 100*  --   --  107  CO2 28 31 30   --   --  26  GLUCOSE 109* 85 243*  --   --  193*  BUN 23* 21* 25*  --   --  21*  CREATININE 0.87 1.20* 1.02*  --   --  1.02*  CALCIUM 9.4 9.6 9.0  --   --  8.6*  MG  --   --   --  0.9* 1.0* 2.1   Liver Function Tests:  Recent Labs Lab 09/02/15 1058 09/04/15 1907 09/07/15 0850  AST 170* 72* 48*  ALT 55* 40 35  ALKPHOS 112 99 86  BILITOT 0.5 0.4 0.6  PROT 7.8 6.1* 6.5  ALBUMIN 4.5 3.5 3.4*    Recent Labs Lab 09/02/15 1058 09/07/15 0900  LIPASE 137* 57*   No results for input(s): AMMONIA in the last 168 hours. CBC:  Recent Labs Lab 09/02/15 1058 09/04/15 1907 09/07/15 0850 09/08/15 0346  WBC 6.1 3.8* 6.4 9.2  NEUTROABS 3.6 1.8 5.0 7.4  HGB 12.8 11.0* 11.5* 9.9*  HCT 39.2 33.5* 34.1* 30.0*  MCV 113.3* 113.9* 111.8* 115.4*  PLT 218 161 162 144*   Cardiac Enzymes:  Recent Labs Lab 09/07/15 0908 09/07/15 1604 09/07/15 2126 09/08/15 0346  TROPONINI 0.24* 0.19* 0.09* 0.06*   BNP (last 3 results)  Recent Labs  09/07/15 0850  BNP 191.0*    ProBNP (last 3 results) No results for input(s): PROBNP in the last 8760 hours.  CBG: No results for input(s): GLUCAP in the last 168 hours.  Recent Results (from the past 240 hour(s))  Urine culture     Status: None (Preliminary result)   Collection Time: 09/07/15  4:00 PM  Result Value Ref Range Status   Specimen Description URINE, CLEAN CATCH  Final   Special Requests NONE  Final   Culture   Final    NO GROWTH < 12 HOURS Performed at [facility]    Report Status PENDING  Incomplete  MRSA PCR Screening     Status: None   Collection Time: 09/07/15  4:03 PM  Result Value Ref Range Status   MRSA by PCR NEGATIVE NEGATIVE Final    Comment:        The GeneXpert MRSA Assay (FDA approved for NASAL specimens only), is one component of a comprehensive MRSA colonization surveillance program. It is  not intended to diagnose MRSA infection nor to guide or monitor treatment for MRSA infections.      Studies: Dg Chest 2 View  09/07/2015  CLINICAL DATA:  Weakness, dizziness, shortness of breath and cough EXAM: CHEST  2 VIEW COMPARISON:  None. FINDINGS: Heart size is normal. No pleural effusion identified. Asymmetric elevation of right hemidiaphragm identified. There are bilateral, upper lobe predominant pulmonary opacities left greater than right. There may be a cavitary process involving the left upper lobe. The visualized osseous structures appear intact. IMPRESSION: 1. Bilateral, upper lobe predominant airspace opacities with possible cavitary process involving the left upper lobe. Recommend further evaluation with CT of the chest. Electronically Signed   By: Kerby Moors M.D.   On: 09/07/2015 10:05   Ct Chest W Contrast  09/07/2015  CLINICAL DATA:  64 year old female with a history of drug abuse. Difficulty breathing this morning. Hypoxemia. EXAM: CT CHEST WITH CONTRAST TECHNIQUE:  Multidetector CT imaging of the chest was performed during intravenous contrast administration. CONTRAST:  57m OMNIPAQUE IOHEXOL 300 MG/ML  SOLN COMPARISON:  No priors. FINDINGS: Mediastinum/Lymph Nodes: Heart size is mildly enlarged. There is no significant pericardial fluid, thickening or pericardial calcification. No pathologically enlarged mediastinal or hilar lymph nodes. Small hiatal hernia. No axillary lymphadenopathy. Lungs/Pleura: Widespread peribronchovascular ground-glass attenuation and thickening of the peribronchovascular interstitium with extensive architectural distortion throughout the lungs bilaterally. No craniocaudal gradient. No pleural effusions. Study is limited by extensive respiratory motion. Upper Abdomen: Liver has a shrunken appearance and nodular contour, compatible with underlying cirrhosis. Musculoskeletal/Soft Tissues: There are no aggressive appearing lytic or blastic lesions noted in  the visualized portions of the skeleton. Compression fracture of superior endplate of TP53with approximately 15% loss of anterior vertebral body height, likely chronic. IMPRESSION: 1. Severe multilobar pneumonia, as above. 2. Mild cardiomegaly. 3. Morphologic changes in the liver compatible with cirrhosis. 4. Small hiatal hernia. Electronically Signed   By: DVinnie LangtonM.D.   On: 09/07/2015 12:33    Scheduled Meds: . busPIRone  15 mg Oral TID  . dabigatran  150 mg Oral BID  . DULoxetine  60 mg Oral Daily  . ferumoxytol  510 mg Intravenous Once  . folic acid  1 mg Oral Daily  . gabapentin  600 mg Oral TID  . guaiFENesin  1,200 mg Oral BID  . ipratropium  0.5 mg Nebulization Q6H  . levalbuterol  0.63 mg Nebulization Q6H  . lidocaine  1 patch Transdermal Q24H  . loratadine  10 mg Oral Daily  . LORazepam  0-4 mg Intravenous Q6H   Followed by  . [START ON 09/09/2015] LORazepam  0-4 mg Intravenous Q12H  . magnesium oxide  400 mg Oral TID  . multivitamin with minerals  1 tablet Oral Daily  . pantoprazole  40 mg Oral Daily  . piperacillin-tazobactam (ZOSYN)  IV  3.375 g Intravenous Q8H  . thiamine  100 mg Oral Daily   Or  . thiamine  100 mg Intravenous Daily  . traZODone  50 mg Oral QHS  . vancomycin  1,000 mg Intravenous Q12H   Continuous Infusions: . sodium chloride 100 mL/hr at 09/08/15 0800    Principal Problem:   HCAP (healthcare-associated pneumonia) Active Problems:   Sepsis, unspecified organism (H[place]   Alcohol dependence ([place]   Hypokalemia   Elevated troponin   Macrocytic anemia   Cirrhosis of liver ([place]: Per CT 09/07/2015   Depression   GAD (generalized anxiety disorder)   DVT (deep venous thrombosis) (HLind[place]: hX OF MULTIPLE DVT PER PATIENT   Hyperlipidemia   GERD (gastroesophageal reflux disease)   Anemia, iron deficiency   Abnormal TSH    Time spent: 426mins    TAscension Seton Edgar B Davis HospitalMD [facility] Pager [phone] If 7PM-7AM, please contact  night-coverage at [email or web address], password TFirst [facility] LLC11/05/2015, 8:41 AM  LOS: 1 day

## 2015-09-08 NOTE — Plan of Care (Signed)
Problem: ICU Phase Progression Outcomes Goal: O2 sats trending toward baseline Outcome: Progressing Patient remains on supplementary oxygen therapy overnight, via 2 LPM/[place]. Pulse oximetry readings remained within the predominant range of the mid-upper 90's.  However, intermittent periods of nocturnal O2 desaturations were evident on the cardiac monitor. Episodes were of low frequency, brief and nonsustained in duration, with minimal variability, remaining within the upper 80's range, occurring while patient was asleep. Patient was encouraged routine use of the Incentive Spirometer device along with deep breathing exercises and demonstrated compliance.

## 2015-09-08 NOTE — Progress Notes (Addendum)
Sleep periods were prolonged and sustained overnight, punctuated by brief periods of wakefulness. Patient easily arouses to verbal stimuli, but drifts back to sleep soon after removal of stimuli. A slight delay or sluggishness in verbal retrieval was evident upon arousal. However, patient showed no signs of confusion and remained fully oriented to all three spheres. CIWA protocol is currently being instituted. Patient demonstrated no signs or symptoms of alcohol withdrawal. Serial CIWA-AR scores remained consistently < 5, obviating need for PRN dosage of Lorazepam. Continuous cardiac monitoring revealed a predominant rhythm of Normal Sinus Rhythm, interspersed with Sinus Tachycardia, with periodic rise of HR to the low 100's. HR increase was asymptomatic, nonsustained and unrelated to physical exertion. Patient remains on continuous, supplemental oxygen therapy via nasal cannula, set at 2 liters/minute. Pulse oximetry readings were predominantly > 92%. There was evidence of transient, nonsustained nocturnal desaturations to < 88 % occurring while patient was asleep, terminating with spontaneous recovery to normal, baseline levels. Patient was encouraged routine use of the Incentive Spirometer device along with deep breathing exercises. Patient was instructed to use the device every 2 hours while awake at 10 breaths per session and coached to optimal performance. Subsequently, patient demonstrated proficiency in the use of the device.

## 2015-09-08 NOTE — Progress Notes (Signed)
  Echocardiogram 2D Echocardiogram has been performed.  Nolon RodBrown, Tony 09/08/2015, 11:08 AM

## 2015-09-08 NOTE — Evaluation (Signed)
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 She demonstrates the below listed deficits and will benefit from continued OT to maximize safety and independence with BADLs.  Pt presents with generalized weakness.  Currently, she requires min A for ADLs.  Her [facility]  She should be able to progress to supervision - mod I level in order to do so.  Will follow acutely.       Follow Up Recommendations  No OT follow up;Supervision - Intermittent    Equipment Recommendations  None recommended by OT    Recommendations for Other Services       Precautions / Restrictions Precautions Precautions: Fall Precaution Comments: monitor sats      Mobility Bed Mobility Overal bed mobility: Needs Assistance Bed Mobility: Supine to Sit;Sit to Supine     Supine to sit: Supervision Sit to supine: Supervision      Transfers Overall transfer level: Needs assistance Equipment used: 1 person hand held assist Transfers: Sit to/from [employer] CorporationStand;Stand Pivot Transfers Sit to Stand: Min assist Stand pivot transfers: Min assist            Balance Overall balance assessment: Needs assistance Sitting-balance support: Feet supported Sitting balance-Leahy Scale: Fair     Standing balance support: Single extremity supported Standing balance-Leahy Scale: Poor                              ADL Overall ADL's : Needs assistance/impaired Eating/Feeding:  Independent   Grooming: Wash/dry hands;Wash/dry face;Oral care;Brushing hair;Minimal assistance;Standing   Upper Body Bathing: Set up;Sitting   Lower Body Bathing: Minimal assistance;Sit to/from stand   Upper Body Dressing : Set up;Sitting   Lower Body Dressing: Minimal assistance;Sit to/from stand   Toilet Transfer: Minimal assistance;Ambulation;Comfort height toilet   Toileting- Clothing Manipulation and Hygiene: Minimal assistance;Sit to/from stand       Functional mobility during ADLs: Minimal assistance General ADL Comments: Sats to 87% on RA, but increased to mid 90s with pursed lip breathing.  Discussed energy conservation techniques      Vision     Perception     Praxis      Pertinent Vitals/Pain Pain Assessment: No/denies pain     Hand Dominance     Extremity/Trunk Assessment Upper Extremity Assessment Upper Extremity Assessment: Generalized weakness   Lower Extremity Assessment Lower Extremity Assessment: Generalized weakness   Cervical / Trunk Assessment Cervical / Trunk Assessment: Normal   Communication Communication Communication: No difficulties   Cognition Arousal/Alertness: Awake/alert Behavior During Therapy: WFL for tasks assessed/performed Overall Cognitive Status: Within Functional Limits for tasks assessed                     General Comments       Exercises Exercises: Other exercises Other Exercises Other Exercises: Pt instructed to perform 2-10 reps shoulder flex or abduction every commercial break    Shoulder Instructions      Home Living Family/patient expects to be discharged to:: Other (Comment) Living Arrangements: Alone  Type of Home: House Home Access: Stairs to enter [employer] of Steps: 3 Entrance Stairs-Rails: Right Home Layout: One level               Home Equipment: Walker - 2 wheels   Additional Comments: Pt with plans to return to [facility] for continued ETOH rehab.  She  reports her spouse died earlier in the year        Prior Functioning/Environment Level of Independence: Independent             OT Diagnosis: Generalized weakness   OT Problem List: Decreased strength;Decreased activity tolerance;Impaired balance (sitting and/or standing);Decreased knowledge of use of DME or AE;Cardiopulmonary status limiting activity   OT Treatment/Interventions: Self-care/ADL training;Therapeutic exercise;DME and/or AE instruction;Therapeutic activities;Patient/family education;Balance training    OT Goals(Current goals can be found in the care plan section) Acute Rehab OT Goals Patient Stated Goal: to return to rehab OT Goal Formulation: With patient Time For Goal Achievement: 09/22/15 Potential to Achieve Goals: Good ADL Goals Pt Will Perform Grooming: with modified independence;sitting;standing Pt Will Perform Upper Body Bathing: with modified independence;sitting;standing Pt Will Perform Lower Body Bathing: with modified independence;sit to/from stand Pt Will Perform Upper Body Dressing: with modified independence;sitting;standing Pt Will Perform Lower Body Dressing: with modified independence;sit to/from stand Pt Will Transfer to Toilet: with modified independence;ambulating;regular height toilet;grab bars Pt Will Perform Toileting - Clothing Manipulation and hygiene: with modified independence;sit to/from stand Pt Will Perform Tub/Shower Transfer: Shower transfer;with modified independence;ambulating;shower seat;rolling walker;grab bars  OT Frequency: Min 2X/week   Barriers to D/C:            Co-evaluation              End of Session Nurse Communication: Mobility status  Activity Tolerance: Patient tolerated treatment well Patient left: in bed;with call bell/phone within reach;with bed alarm set;with nursing/sitter in room   Time: 3664-4034 OT Time Calculation (min): 46 min Charges:  OT General Charges $OT Visit: 1 Procedure OT  Evaluation $Initial OT Evaluation Tier I: 1 Procedure OT Treatments $Therapeutic Activity: 23-37 mins G-Codes:    Keonda Dow M 2015-09-28, 5:28 PM

## 2015-09-08 NOTE — Progress Notes (Signed)
MEDICATION RELATED CONSULT NOTE - INITIAL   Pharmacy Consult for Anemia/Iron Replacement Indication: Anemia  No Known Allergies  Patient Measurements: Height:  (160 cm) Weight: 175 lb 11.3 oz (79.7 kg) IBW/kg (Calculated) : 52.4  Vital Signs: Temp: 98.9 F (37.2 C) (11/07 0400) Temp Source: Oral (11/07 0400) BP: 118/80 mmHg (11/07 0600) Pulse Rate: 93 (11/07 0600) Intake/Output from previous day: 11/06 0701 - 11/07 0700 In: 8021.7 [P.O.:360; I.V.:4228.3; IV Piggyback:3433.3] Out: 790 [Urine:790] Intake/Output from this shift:    Labs:  Recent Labs  09/07/15 0850 09/07/15 0900 09/07/15 1415 09/07/15 1600 09/08/15 0346  WBC 6.4  --   --   --  9.2  HGB 11.5*  --   --   --  9.9*  HCT 34.1*  --   --   --  30.0*  PLT 162  --   --   --  144*  APTT  --   --  43*  --   --   CREATININE 1.02*  --   --   --  1.02*  MG  --  0.9*  --  1.0* 2.1  ALBUMIN 3.4*  --   --   --   --   PROT 6.5  --   --   --   --   AST 48*  --   --   --   --   ALT 35  --   --   --   --   ALKPHOS 86  --   --   --   --   BILITOT 0.6  --   --   --   --    Estimated Creatinine Clearance: 56.4 mL/min (by C-G formula based on Cr of 1.02).  Microbiology: Recent Results (from the past 720 hour(s))  MRSA PCR Screening     Status: None   Collection Time: 09/07/15  4:03 PM  Result Value Ref Range Status   MRSA by PCR NEGATIVE NEGATIVE Final    Comment:        The GeneXpert MRSA Assay (FDA approved for NASAL specimens only), is one component of a comprehensive MRSA colonization surveillance program. It is not intended to diagnose MRSA infection nor to guide or monitor treatment for MRSA infections.    Medical History: Past Medical History  Diagnosis Date  . Hypertension   . Pancreatitis     x3  . Depression   . Alcohol dependence (HCC) 09/07/2015  . Macrocytic anemia 09/07/2015  . Cirrhosis of liver [facility]): Per CT 09/07/2015 09/07/2015  . GAD (generalized anxiety disorder) 09/07/2015  .  DVT (deep venous thrombosis) (HCC): hX OF MULTIPLE DVT PER PATIENT 09/07/2015  . Hyperlipidemia 09/07/2015  . GERD (gastroesophageal reflux disease) 09/07/2015  . Anemia, iron deficiency 09/08/2015   Medications:  Scheduled:  . busPIRone  15 mg Oral TID  . dabigatran  150 mg Oral BID  . DULoxetine  60 mg Oral Daily  . ferumoxytol  510 mg Intravenous Once  . folic acid  1 mg Oral Daily  . gabapentin  600 mg Oral TID  . guaiFENesin  1,200 mg Oral BID  . ipratropium  0.5 mg Nebulization Q6H  . levalbuterol  0.63 mg Nebulization Q6H  . lidocaine  1 patch Transdermal Q24H  . loratadine  10 mg Oral Daily  . LORazepam  0-4 mg Intravenous Q6H   Followed by  . [START ON 09/09/2015] LORazepam  0-4 mg Intravenous Q12H  . magnesium oxide  400 mg Oral  TID  . multivitamin with minerals  1 tablet Oral Daily  . oseltamivir  75 mg Oral BID  . pantoprazole  40 mg Oral Daily  . piperacillin-tazobactam (ZOSYN)  IV  3.375 g Intravenous Q8H  . thiamine  100 mg Oral Daily   Or  . thiamine  100 mg Intravenous Daily  . traZODone  50 mg Oral QHS  . vancomycin  1,000 mg Intravenous Q12H   Assessment: 64 [facility] out [facility]; PNA on Chest CT. Hx of ETOH abuse, iron deficiency anemia with Fe level of 8, low TIBC, Fe sats.  Goal of Therapy:  Iron replacement  Plan:   Iron replacement, use Feraheme 510mg  IV x1 today, no test dose required  Repeat Fe/TIBC on 11/10, can repeat if needed  Otho BellowsGreen, Sosaia Pittinger L PharmD Pager [phone] 09/08/2015, 8:23 AM

## 2015-09-08 NOTE — Evaluation (Signed)
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 Pt currently with functional limitations due to the deficits listed below (see PT Problem List).  Pt will benefit from skilled PT to increase their independence and safety with mobility to allow discharge to the venue listed below.   Pt able to tolerate ambulating in hallway with RW and eager to continue mobilizing.  Requested pt ambulate with nursing staff as able.  Pt plans to return to PLOF upon d/c.     Follow Up Recommendations No PT follow up    Equipment Recommendations  None recommended by PT    Recommendations for Other Services       Precautions / Restrictions Precautions Precautions: Fall Precaution Comments: monitor sats      Mobility  Bed Mobility Overal bed mobility: Needs Assistance Bed Mobility: Supine to Sit;Sit to Supine     Supine to sit: Supervision Sit to supine: Supervision   General bed mobility comments: supervision for lines/leads  Transfers Overall transfer level: Needs assistance Equipment used: Rolling walker (2 wheeled) Transfers: Sit to/from Stand Sit to Stand: Min guard         General transfer comment: verbal cues for hand placement  Ambulation/Gait Ambulation/Gait assistance: Min guard Ambulation Distance (Feet): 130 Feet Assistive device: Rolling walker (2 wheeled) Gait Pattern/deviations: Step-through pattern;Decreased stride length     General Gait Details: slow but steady pace with RW, mild SOB, SpO2 not  reading during gait however 90% on room air once back inside room  Stairs            Wheelchair Mobility    Modified Rankin (Stroke Patients Only)       Balance                                             Pertinent Vitals/Pain Pain Assessment: No/denies pain    Home Living Family/patient expects to be discharged to:: Private residence Living Arrangements: Spouse/significant other   Type of Home: House Home Access: Stairs to enter Entrance Stairs-Rails: Right Entrance Stairs-Number of Steps: 3 Home Layout: One level Home Equipment: Environmental consultantWalker - 2 wheels Additional Comments: per chart, admitted from [facility]    Prior Function Level of Independence: Independent               Hand Dominance        Extremity/Trunk Assessment               Lower Extremity Assessment: Generalized weakness      Cervical / Trunk Assessment: Normal  Communication   Communication: No difficulties  Cognition Arousal/Alertness: Awake/alert Behavior During Therapy: WFL for tasks assessed/performed Overall Cognitive Status: Within Functional Limits for tasks assessed                      General Comments      Exercises        Assessment/Plan    PT Assessment Patient needs continued PT services  PT Diagnosis Difficulty walking  PT Problem List Decreased strength;Decreased mobility;Decreased activity tolerance  PT Treatment Interventions Gait training;DME instruction;Patient/family education;Functional mobility training;Therapeutic activities;Therapeutic exercise   PT Goals (Current goals can be found in the Care Plan section) Acute Rehab PT Goals Patient Stated Goal: return to PLOF PT Goal Formulation: With patient Time For Goal Achievement: 09/15/15 Potential to Achieve Goals: Good    Frequency Min 3X/week   Barriers to discharge        Co-evaluation               End of Session   Activity Tolerance: Patient  tolerated treatment well Patient left: in bed;with call bell/phone within reach;with bed alarm set Nurse Communication: Mobility status         Time: 1610-9604 PT Time Calculation (min) (ACUTE ONLY): 13 min   Charges:   PT Evaluation $Initial PT Evaluation Tier I: 1 Procedure     PT G Codes:        Abigail Santiago,Abigail Santiago 09/08/2015, 12:39 PM Abigail Santiago, PT, DPT 09/08/2015 Pager: [phone]

## 2015-09-08 NOTE — Care Management Note (Signed)
Case Management Note  Patient Details  Name: Abigail ParkerSusan Santiago MRN: [id number] Date of Birth: 05/03/1951  Subjective/Objective:     Severe multilobar pna per cxay and ct chest               Action/Plan:Date: September 08, 2015 Chart reviewed for concurrent status and case management needs. Will continue to follow patient for changes and needs: Marcelle Smilinghonda Davis, RN, BSN, [place]   [phone]  Expected Discharge Date:  09/10/15               Expected Discharge Plan:  Home/Self Care  In-House Referral:  NA  Discharge planning Services  CM Consult  Post Acute Care Choice:  NA Choice offered to:  NA  DME Arranged:    DME Agency:     HH Arranged:    HH Agency:     Status of Service:  In process, will continue to follow  Medicare Important Message Given:    Date Medicare IM Given:    Medicare IM give by:    Date Additional Medicare IM Given:    Additional Medicare Important Message give by:     If discussed at Long Length of Stay Meetings, dates discussed:    Additional Comments:  Golda AcreDavis, Rhonda Lynn, RN 09/08/2015, 9:58 AM

## 2015-09-08 NOTE — Progress Notes (Signed)
CSW is available to assist with d/c planning. Pt is from [facility]. Pt is sleeping soundly at this time. CSW will return in the am to offer assistance with d/c planning needs.  Cori RazorJamie Kimari Coudriet LCSW [phone]

## 2015-09-09 ENCOUNTER — Inpatient Hospital Stay (HOSPITAL_COMMUNITY): Payer: No Typology Code available for payment source

## 2015-09-09 DIAGNOSIS — E8770 Fluid overload, unspecified: Secondary | ICD-10-CM | POA: Diagnosis not present

## 2015-09-09 LAB — BASIC METABOLIC PANEL
Anion gap: 7 (ref 5–15)
BUN: 18 mg/dL (ref 6–20)
CHLORIDE: 105 mmol/L (ref 101–111)
CO2: 24 mmol/L (ref 22–32)
CREATININE: 0.87 mg/dL (ref 0.44–1.00)
Calcium: 8.7 mg/dL — ABNORMAL LOW (ref 8.9–10.3)
GFR calc Af Amer: >60 mL/min (ref >60)
GFR calc non Af Amer: >60 mL/min (ref >60)
GLUCOSE: 129 mg/dL — AB (ref 65–99)
POTASSIUM: 4.7 mmol/L (ref 3.5–5.1)
SODIUM: 136 mmol/L (ref 135–145)

## 2015-09-09 LAB — CBC
HCT: 30.8 % — ABNORMAL LOW (ref 36.0–46.0)
Hemoglobin: 10 g/dL — ABNORMAL LOW (ref 12.0–15.0)
MCH: 37.5 pg — AB (ref 26.0–34.0)
MCHC: 32.5 g/dL (ref 30.0–36.0)
MCV: 115.4 fL — AB (ref 78.0–100.0)
PLATELETS: 178 10*3/uL (ref 150–400)
RBC: 2.67 MIL/uL — AB (ref 3.87–5.11)
RDW: 15.2 % (ref 11.5–15.5)
WBC: 10 10*3/uL (ref 4.0–10.5)

## 2015-09-09 LAB — T3: T3 TOTAL: 66 ng/dL — AB (ref 71–180)

## 2015-09-09 LAB — MAGNESIUM: Magnesium: 1.7 mg/dL (ref 1.7–2.4)

## 2015-09-09 MED ORDER — IPRATROPIUM BROMIDE 0.02 % IN SOLN
0.5000 mg | Freq: Four times a day (QID) | RESPIRATORY_TRACT | Status: DC
  Administered 2015-09-09 – 2015-09-13 (×16): 0.5 mg via RESPIRATORY_TRACT
  Filled 2015-09-09 (×15): qty 2.5

## 2015-09-09 MED ORDER — HYDRALAZINE HCL 20 MG/ML IJ SOLN
5.0000 mg | Freq: Four times a day (QID) | INTRAMUSCULAR | Status: DC | PRN
Start: 2015-09-09 — End: 2015-09-16
  Administered 2015-09-09: 5 mg via INTRAVENOUS
  Filled 2015-09-09 (×2): qty 1

## 2015-09-09 MED ORDER — FUROSEMIDE 10 MG/ML IJ SOLN
40.0000 mg | Freq: Two times a day (BID) | INTRAMUSCULAR | Status: DC
  Administered 2015-09-09: 40 mg via INTRAVENOUS
  Filled 2015-09-09: qty 4

## 2015-09-09 MED ORDER — MAGNESIUM SULFATE 50 % IJ SOLN
3.0000 g | Freq: Once | INTRAMUSCULAR | Status: AC
  Administered 2015-09-09: 3 g via INTRAVENOUS
  Filled 2015-09-09: qty 6

## 2015-09-09 MED ORDER — FUROSEMIDE 10 MG/ML IJ SOLN
40.0000 mg | Freq: Once | INTRAMUSCULAR | Status: AC
  Administered 2015-09-09: 40 mg via INTRAVENOUS
  Filled 2015-09-09: qty 4

## 2015-09-09 MED ORDER — FUROSEMIDE 10 MG/ML IJ SOLN
40.0000 mg | Freq: Two times a day (BID) | INTRAMUSCULAR | Status: AC
  Administered 2015-09-10 – 2015-09-11 (×3): 40 mg via INTRAVENOUS
  Filled 2015-09-09 (×3): qty 4

## 2015-09-09 MED ORDER — LEVALBUTEROL HCL 0.63 MG/3ML IN NEBU
0.6300 mg | INHALATION_SOLUTION | Freq: Four times a day (QID) | RESPIRATORY_TRACT | Status: DC
  Administered 2015-09-10 – 2015-09-13 (×12): 0.63 mg via RESPIRATORY_TRACT
  Filled 2015-09-09 (×11): qty 3

## 2015-09-09 MED ORDER — LEVALBUTEROL HCL 0.63 MG/3ML IN NEBU
0.6300 mg | INHALATION_SOLUTION | Freq: Four times a day (QID) | RESPIRATORY_TRACT | Status: DC | PRN
  Filled 2015-09-09: qty 3

## 2015-09-09 MED ORDER — LEVALBUTEROL HCL 0.63 MG/3ML IN NEBU
0.6300 mg | INHALATION_SOLUTION | RESPIRATORY_TRACT | Status: DC
  Administered 2015-09-09 (×5): 0.63 mg via RESPIRATORY_TRACT
  Filled 2015-09-09 (×5): qty 3

## 2015-09-09 NOTE — Clinical Social Work Note (Signed)
Clinical Social Work Assessment  Patient Details  Name: Abigail Santiago MRN: [id number] Date of Birth: 03-Apr-1951  Date of referral:  09/09/15               Reason for consult:  Discharge Planning                Permission sought to share information with:  Facility Art therapist granted to share information::  Yes, Verbal Permission Granted  Name::        Agency::     Relationship::     Contact Information:     Housing/Transportation Living arrangements for the past 2 months:  Single Family Home ([facility] [place]) Source of Information:  Patient Patient Interpreter Needed:  None Criminal Activity/Legal Involvement Pertinent to Current Situation/Hospitalization:  No - Comment as needed Significant Relationships:  Siblings Lives with:  Self Do you feel safe going back to the place where you live?  Yes Need for family participation in patient care:  No (Coment)  Care giving concerns:  No concerns reported by pt.   Social Worker assessment / plan:  Pt hospitalized on 09/07/15 from [place] with HCAP and Sepsis. CSW met with pt to assist with d/c planning needs. Pt reports this is her second admission to [facility]. First admission took place in 2012. Pt reports that she suffered from multiple losses in a short time frame and began drinking again. Pt reports that she requested admission to [place] and hopes to return when stable for d/c. CSW has left a message for Katharine Look ( DON ) from [name] to contact CSW for assistance with d/c planning. Pt's NSG reports that Katharine Look had contacted [facility] on 09/08/15 and reported [facility]'s [facility] days.   Employment status:  Retired Nurse, adult PT Recommendations:  No Follow Up Information / Referral to community resources:  Residential Substance Abuse Treatment Options  Patient/Family's Response to care:  Pt would like to return to [facility] to  complete her treatment program.  Patient/Family's Understanding of and Emotional Response to Diagnosis, Current Treatment, and Prognosis:  Pt is aware of her medical status. " I knew I was sick at [facility] but all I wanted to do was sleep. "   Emotional Assessment Appearance:  Appears stated age Attitude/Demeanor/Rapport:  Other (cooperative) Affect (typically observed):  Calm, Grieving Orientation:  Oriented to Self, Oriented to Place, Oriented to  Time, Oriented to Situation Alcohol / Substance use:  Alcohol Use Psych involvement (Current and /or in the community):  No (Comment)  Discharge Needs  Concerns to be addressed:  Discharge Planning Concerns Readmission within the last 30 days:  No Current discharge risk:  None Barriers to Discharge:  No Barriers Identified   Luretha Rued, [place] 09/09/2015, 2:19 PM

## 2015-09-09 NOTE — Progress Notes (Signed)
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 Workup done in the ED including chest x-ray and CT scan of the chest was consistent with a multifocal pneumonia. Patient was admitted to the step down unit. Patient was placed empirically on IV vancomycin and IV Zosyn. Patient was also placed on Ativan withdrawal protocol. On admission patient was noted to be septic and placed on the sepsis protocol. Patient has been pancultured and results pending. Patient noted to have worsening shortness of breath with elevated blood pressure and clinical findings consistent with volume overload. Patient was placed on IV Lasix. Patient also on empiric IV antibiotics oxygen, nebulizer treatments, supportive care.  Assessment/Plan: #1 multilobar healthcare associated pneumonia Patient had presented with fevers, shortness of breath, clinical findings consistent with a pneumonia, elevated lactic acid level, chest x-ray and CT findings concerning for multilobar pneumonia. Patient with some clinical improvement. Blood cultures pending. Sputum Gram stain and culture pending. Lactic acid level has trended down. Urine streptococcus antigen is negative. Urine Legionella antigen is negative. Will DC IV vancomycin. Continue IV Zosyn. Continue oxygen, Mucinex, nebulizers. Follow.  #2 sepsis likely secondary to healthcare associated pneumonia Patient met criteria for sepsis with tachycardia, tachypnea, borderline blood pressure, hypoxia  when patient was first picked up by EMS with sats in the 60s to 70s. Lactic acid was also elevated. Hypoxia has improved. Lactic acid trended down. Chest x-ray CT scan consistent with a multilobar pneumonia. Patient has been pancultured and results pending. Urinalysis is nitrite negative leukocytes negative. Continue IV fluids, empiric IV antibiotics, oxygen, Mucinex, nebulizer treatments. Follow.  #3 volume overload Patient with worsening shortness of breath overnight and this morning. Patient also hypertensive. Patient noted to have diffuse crackles on examination. Patient is +7.5 L during this hospitalization. Chest x-ray this morning consistent with volume overload superimposed on pneumonia. Saline lock IV fluids. Will place on Lasix 40 mg IV every 12 hours. Strict I's and Os. Daily weights. Follow.  #4 hypokalemia/hypomagnesemia Repleted.  #5 elevated troponin Likely secondary to sepsis and current infection. Cardiac enzymes trending down. 2-D echo with EF of 55-60% with trivial aortic valvular regurgitation. No further workup needed at this time.   #6 iron deficiency anemia/microcytic anemia Patient with no overt bleeding. Anemia panel consistent with iron deficiency anemia with iron level of 8 and a ferritin of 153 with a folate of 31, B-12 = 304.Hemoglobin currently at 10,0 from 11.5 on admission. Partial dilutional effect. Patient has received IV iron dose. Will likely need to be on oral iron supplementation on discharge.  #7 history of DVTs Pradaxa.  #8 gastroesophageal reflux disease PPI.  #9 history of alcohol dependence Patient [facility] at [name] alcohol rehabilitation/[facility]ox prior to admission. Continue the Ativan withdrawal protocol. Continue thiamine, multivitamin, folic acid. Will likely need to go back to [facility] on discharge.   #10?? Hyperlipidemia Fasting lipid panel not indicative of  hyperlipidemia.Total cholesterol is 121. HDL is 84. LDL is 30. No need  for any medications at this time. Outpatient follow-up.  #11   depression/general anxiety disorder Stable. Continue Cymbalta and BuSpar.  #12 cirrhosis of the  liver per CT scan Will likely need outpatient follow-up. [facility].  #13 abnormal TSH Free T4 within normal limits. T3 pending. Likely euthyroid sick syndrome. Patient will likely need repeat thyroid function studies done in about 4-6 weeks.   #14 prophylaxis PPI for GI prophylaxis. Pradaxa for DVT prophylaxis.    Code Status: Full Family Communication: Updated patient. No family at bedside. Disposition Plan: Remain in the step down unit.   Consultants:  None  Procedures:  Chest x-ray 09/07/2015, 09/09/2015  CT chest 09/07/2015  2-D echo 09/08/2015  Antibiotics:  IV vancomycin 09/07/2015>>>> 09/09/2015  IV Zosyn 09/07/2015  HPI/Subjective: Patient with complaints of shortness of breath. Patient states not feeling well. Patient on a nonrebreather. Patient noted to be hypertensive overnight.  Objective: Filed Vitals:   09/09/15 0800  BP: 176/115  Pulse: 108  Temp: 98.6 F (37 C)  Resp: 26    Intake/Output Summary (Last 24 hours) at 09/09/15 1000 Last data filed at 09/09/15 0700  Gross per 24 hour  Intake 2318.75 ml  Output   1975 ml  Net 343.75 ml   Filed Weights   09/07/15 1346 09/07/15 1448 09/08/15 0600  Weight: 74.844 kg (165 lb) 77.6 kg (171 lb 1.2 oz) 79.7 kg (175 lb 11.3 oz)    Exam:   General:  NAD.   Cardiovascular: Tachycardia  Respiratory: Diffuse coarse breath sounds with rhonchi. Diffuse crackles. Some use of accessory muscles of respiration.  Abdomen: Soft, nontender, nondistended, positive bowel sounds.  Musculoskeletal: No clubbing cyanosis or edema.  Data Reviewed: Basic Metabolic Panel:  Recent Labs Lab 09/02/15 1058 09/04/15 1907 09/07/15 0850 09/07/15 0900 09/07/15 1600 09/08/15 0346 09/09/15 0354  NA 143  139 141  --   --  139 136  K 4.6 4.5 3.2*  --   --  4.6 4.7  CL 100* 99* 100*  --   --  107 105  CO2 28 31 30   --   --  26 24  GLUCOSE 109* 85 243*  --   --  193* 129*  BUN 23* 21* 25*  --   --  21* 18  CREATININE 0.87 1.20* 1.02*  --   --  1.02* 0.87  CALCIUM 9.4 9.6 9.0  --   --  8.6* 8.7*  MG  --   --   --  0.9* 1.0* 2.1 1.7   Liver Function Tests:  Recent Labs Lab 09/02/15 1058 09/04/15 1907 09/07/15 0850  AST 170* 72* 48*  ALT 55* 40 35  ALKPHOS 112 99 86  BILITOT 0.5 0.4 0.6  PROT 7.8 6.1* 6.5  ALBUMIN 4.5 3.5 3.4*    Recent Labs Lab 09/02/15 1058 09/07/15 0900  LIPASE 137* 57*   No results for input(s): AMMONIA in the last 168 hours. CBC:  Recent Labs Lab 09/02/15 1058 09/04/15 1907 09/07/15 0850 09/08/15 0346 09/09/15 0354  WBC 6.1 3.8* 6.4 9.2 10.0  NEUTROABS 3.6 1.8 5.0 7.4  --   HGB 12.8 11.0* 11.5* 9.9* 10.0*  HCT 39.2 33.5* 34.1* 30.0* 30.8*  MCV 113.3* 113.9* 111.8* 115.4* 115.4*  PLT 218 161 162 144* 178   Cardiac Enzymes:  Recent Labs Lab 09/07/15 0908 09/07/15 1604 09/07/15 2126 09/08/15 0346  TROPONINI 0.24* 0.19* 0.09* 0.06*   BNP (last 3 results)  Recent Labs  09/07/15 0850  BNP 191.0*    ProBNP (last 3 results) No results for input(s): PROBNP in the last 8760 hours.  CBG: No results for  input(s): GLUCAP in the last 168 hours.  Recent Results (from the past 240 hour(s))  Blood culture (routine x 2)     Status: None (Preliminary result)   Collection Time: 09/07/15 11:17 AM  Result Value Ref Range Status   Specimen Description BLOOD RIGHT HAND  Final   Special Requests BOTTLES DRAWN AEROBIC AND ANAEROBIC 5CC  Final   Culture   Final    NO GROWTH < 24 HOURS Performed at [facility]    Report Status PENDING  Incomplete  Blood culture (routine x 2)     Status: None (Preliminary result)   Collection Time: 09/07/15 11:26 AM  Result Value Ref Range Status   Specimen Description BLOOD LEFT HAND  Final   Special  Requests BOTTLES DRAWN AEROBIC AND ANAEROBIC 5CC  Final   Culture   Final    NO GROWTH < 24 HOURS Performed at [facility]    Report Status PENDING  Incomplete  Urine culture     Status: None   Collection Time: 09/07/15  4:00 PM  Result Value Ref Range Status   Specimen Description URINE, CLEAN CATCH  Final   Special Requests NONE  Final   Culture   Final    NO GROWTH 1 DAY Performed at [facility]    Report Status 09/08/2015 FINAL  Final  MRSA PCR Screening     Status: None   Collection Time: 09/07/15  4:03 PM  Result Value Ref Range Status   MRSA by PCR NEGATIVE NEGATIVE Final    Comment:        The GeneXpert MRSA Assay (FDA approved for NASAL specimens only), is one component of a comprehensive MRSA colonization surveillance program. It is not intended to diagnose MRSA infection nor to guide or monitor treatment for MRSA infections.      Studies: Ct Chest W Contrast  09/07/2015  CLINICAL DATA:  64 year old female with a history of drug abuse. Difficulty breathing this morning. Hypoxemia. EXAM: CT CHEST WITH CONTRAST TECHNIQUE: Multidetector CT imaging of the chest was performed during intravenous contrast administration. CONTRAST:  58m OMNIPAQUE IOHEXOL 300 MG/ML  SOLN COMPARISON:  No priors. FINDINGS: Mediastinum/Lymph Nodes: Heart size is mildly enlarged. There is no significant pericardial fluid, thickening or pericardial calcification. No pathologically enlarged mediastinal or hilar lymph nodes. Small hiatal hernia. No axillary lymphadenopathy. Lungs/Pleura: Widespread peribronchovascular ground-glass attenuation and thickening of the peribronchovascular interstitium with extensive architectural distortion throughout the lungs bilaterally. No craniocaudal gradient. No pleural effusions. Study is limited by extensive respiratory motion. Upper Abdomen: Liver has a shrunken appearance and nodular contour, compatible with underlying cirrhosis.  Musculoskeletal/Soft Tissues: There are no aggressive appearing lytic or blastic lesions noted in the visualized portions of the skeleton. Compression fracture of superior endplate of TA83with approximately 15% loss of anterior vertebral body height, likely chronic. IMPRESSION: 1. Severe multilobar pneumonia, as above. 2. Mild cardiomegaly. 3. Morphologic changes in the liver compatible with cirrhosis. 4. Small hiatal hernia. Electronically Signed   By: DVinnie LangtonM.D.   On: 09/07/2015 12:33   Dg Chest Port 1 View  09/09/2015  CLINICAL DATA:  Shortness of breath . EXAM: PORTABLE CHEST 1 VIEW COMPARISON:  CT 09/07/2015.  Chest x-ray 09/07/2015. FINDINGS: Cardiomegaly diffuse bilateral from interstitial prominence particular the upper lobes consistent with congestive heart failure. Bilateral upper lobe pneumonia, right side greater than left cannot be excluded. Cavitary infiltrate left upper lobe again cannot be excluded. No prominent pleural effusion or pneumothorax. IMPRESSION: 1.  Cardiomegaly with interim progression of diffuse bilateral pulmonary interstitial prominence consistent with congestive heart failure. 2. Bilateral upper lobe pulmonary infiltrates, right side greater than left. Pneumonia cannot be excluded. Cavitary infiltrate in the left upper lobe again cannot be excluded. Electronically Signed   By: Marcello Moores  Register   On: 09/09/2015 09:14    Scheduled Meds: . busPIRone  15 mg Oral TID  . dabigatran  150 mg Oral BID  . DULoxetine  60 mg Oral Daily  . folic acid  1 mg Oral Daily  . furosemide  40 mg Intravenous Q12H  . gabapentin  600 mg Oral TID  . guaiFENesin  1,200 mg Oral BID  . ipratropium  0.5 mg Nebulization QID  . levalbuterol  0.63 mg Nebulization Q4H WA  . lidocaine  1 patch Transdermal Q24H  . loratadine  10 mg Oral Daily  . LORazepam  0-4 mg Intravenous Q6H   Followed by  . LORazepam  0-4 mg Intravenous Q12H  . magnesium oxide  400 mg Oral TID  . magnesium sulfate  1 - 4 g bolus IVPB  3 g Intravenous Once  . multivitamin with minerals  1 tablet Oral Daily  . pantoprazole  40 mg Oral Daily  . piperacillin-tazobactam (ZOSYN)  IV  3.375 g Intravenous Q8H  . thiamine  100 mg Oral Daily   Or  . thiamine  100 mg Intravenous Daily  . traZODone  50 mg Oral QHS  . vancomycin  1,000 mg Intravenous Q12H   Continuous Infusions: . sodium chloride Stopped (09/09/15 0700)    Principal Problem:   HCAP (healthcare-associated pneumonia) Active Problems:   Sepsis, unspecified organism ([place])   Alcohol dependence ([place])   Hypokalemia   Elevated troponin   Macrocytic anemia   Cirrhosis of liver ([place]): Per CT 09/07/2015   Depression   GAD (generalized anxiety disorder)   DVT (deep venous thrombosis) ([place]): hX OF MULTIPLE DVT PER PATIENT   Hyperlipidemia   GERD (gastroesophageal reflux disease)   Anemia, iron deficiency   Abnormal TSH   Volume overload    Time spent: 40 mins    [facility] MD [facility] Pager [phone]. If 7PM-7AM, please contact night-coverage at [email or web address], password [facility] 09/09/2015, 10:00 AM  LOS: 2 days

## 2015-09-10 ENCOUNTER — Inpatient Hospital Stay (HOSPITAL_COMMUNITY): Payer: No Typology Code available for payment source

## 2015-09-10 DIAGNOSIS — D509 Iron deficiency anemia, unspecified: Secondary | ICD-10-CM

## 2015-09-10 DIAGNOSIS — E8770 Fluid overload, unspecified: Secondary | ICD-10-CM

## 2015-09-10 DIAGNOSIS — F329 Major depressive disorder, single episode, unspecified: Secondary | ICD-10-CM

## 2015-09-10 DIAGNOSIS — J189 Pneumonia, unspecified organism: Secondary | ICD-10-CM

## 2015-09-10 DIAGNOSIS — D539 Nutritional anemia, unspecified: Secondary | ICD-10-CM

## 2015-09-10 DIAGNOSIS — R7989 Other specified abnormal findings of blood chemistry: Secondary | ICD-10-CM

## 2015-09-10 DIAGNOSIS — F1020 Alcohol dependence, uncomplicated: Secondary | ICD-10-CM

## 2015-09-10 DIAGNOSIS — A419 Sepsis, unspecified organism: Principal | ICD-10-CM

## 2015-09-10 LAB — CBC WITH DIFFERENTIAL/PLATELET
BASOS PCT: 0 %
Basophils Absolute: 0 10*3/uL (ref 0.0–0.1)
EOS PCT: 3 %
Eosinophils Absolute: 0.3 10*3/uL (ref 0.0–0.7)
HEMATOCRIT: 28.3 % — AB (ref 36.0–46.0)
Hemoglobin: 9.4 g/dL — ABNORMAL LOW (ref 12.0–15.0)
LYMPHS ABS: 1.4 10*3/uL (ref 0.7–4.0)
Lymphocytes Relative: 16 %
MCH: 37.2 pg — AB (ref 26.0–34.0)
MCHC: 33.2 g/dL (ref 30.0–36.0)
MCV: 111.9 fL — AB (ref 78.0–100.0)
MONOS PCT: 11 %
Monocytes Absolute: 0.9 10*3/uL (ref 0.1–1.0)
NEUTROS ABS: 6 10*3/uL (ref 1.7–7.7)
Neutrophils Relative %: 70 %
Platelets: 183 10*3/uL (ref 150–400)
RBC: 2.53 MIL/uL — ABNORMAL LOW (ref 3.87–5.11)
RDW: 15 % (ref 11.5–15.5)
WBC: 8.6 10*3/uL (ref 4.0–10.5)

## 2015-09-10 LAB — MAGNESIUM: Magnesium: 1.9 mg/dL (ref 1.7–2.4)

## 2015-09-10 LAB — BASIC METABOLIC PANEL
Anion gap: 7 (ref 5–15)
BUN: 24 mg/dL — ABNORMAL HIGH (ref 6–20)
CALCIUM: 8.8 mg/dL — AB (ref 8.9–10.3)
CO2: 33 mmol/L — AB (ref 22–32)
CREATININE: 1.11 mg/dL — AB (ref 0.44–1.00)
Chloride: 98 mmol/L — ABNORMAL LOW (ref 101–111)
GFR calc Af Amer: 60 mL/min — ABNORMAL LOW (ref >60)
GFR calc non Af Amer: 52 mL/min — ABNORMAL LOW (ref >60)
GLUCOSE: 124 mg/dL — AB (ref 65–99)
Potassium: 3.8 mmol/L (ref 3.5–5.1)
Sodium: 138 mmol/L (ref 135–145)

## 2015-09-10 MED ORDER — POLYSACCHARIDE IRON COMPLEX 150 MG PO CAPS
150.0000 mg | ORAL_CAPSULE | Freq: Every day | ORAL | Status: DC
  Administered 2015-09-10 – 2015-09-16 (×7): 150 mg via ORAL
  Filled 2015-09-10 (×8): qty 1

## 2015-09-10 NOTE — Progress Notes (Signed)
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 Assisted to EOB required + 2 assist.  O2 sats decreased to low 80's with poor return.  Increased O2 to 6 lts to achieve therapeutic level for activity.  Pt was only able to amb 6 feet with noted dyspnea and sats decreased to low 80's.  Recliner brought to pt.  Returned to room when RT arrived.  Pt c/o chest pain.  Assisted back to bed.  Follow Up Recommendations  No PT follow up     Equipment Recommendations  None recommended by PT    Recommendations for Other Services       Precautions / Restrictions Precautions Precautions: Fall Precaution Comments: monitor sats Restrictions Weight Bearing Restrictions: No    Mobility  Bed Mobility Overal bed mobility: Needs Assistance Bed Mobility: Supine to Sit;Sit to Supine     Supine to sit: Mod assist;Max assist Sit to supine: Mod assist;Max assist   General bed mobility comments: increased time and use of bed pad to assist with scooting to EOB and back to bed.  Transfers Overall transfer level: Needs assistance Equipment used: Rolling walker (2 wheeled) Transfers: Sit to/from Stand Sit to Stand: Min assist;+2 safety/equipment         General transfer comment: verbal cues for hand placement.  + 2 assist for safety lines  Ambulation/Gait Ambulation/Gait assistance: Min assist Ambulation Distance (Feet): 6 Feet Assistive device:  Rolling walker (2 wheeled) Gait Pattern/deviations: Step-to pattern;Step-through pattern Gait velocity: decreased   General Gait Details: decreased amb distance due to increased c/o upper chest pain with inhalation and O2 sats decreasing with activity despite use of oxygen.  Started on 3 lts at rest 96% but with amb decreased to low 80's with poor recovery despite rest and purse lip breathing.     Stairs            Wheelchair Mobility    Modified Rankin (Stroke Patients Only)       Balance                                    Cognition Arousal/Alertness: Awake/alert Behavior During Therapy: WFL for tasks assessed/performed Overall Cognitive Status: Within Functional Limits for tasks assessed                      Exercises      General Comments        Pertinent Vitals/Pain Pain Assessment: Faces Faces Pain Scale: Hurts little more Pain Location: chest esp with deep breathing Pain Descriptors / Indicators: Constant Pain Intervention(s): Monitored during session    Home Living                      Prior Function            PT Goals (current goals can now be found in the care plan section) Progress towards PT goals:  Progressing toward goals    Frequency  Min 3X/week    PT Plan      Co-evaluation             End of Session Equipment Utilized During Treatment: Gait belt Activity Tolerance: Treatment limited secondary to medical complications (Comment) Patient left: in bed;with call bell/phone within reach     Time: 1400-1430 PT Time Calculation (min) (ACUTE ONLY): 30 min  Charges:  $Gait Training: 8-22 mins $Therapeutic Activity: 8-22 mins                    G Codes:      Felecia Shelling  PTA [facility]  Acute  Rehab Pager      [phone]

## 2015-09-10 NOTE — Progress Notes (Signed)
CSW spoke with Cristal GenerousSandra, DON, From [facility] this am, to provide medical update. Dois DavenportSandra will readmit pt provided a bed is available at time of d/c. CSW will continue to follow to assist with d/c planning needs.  Cori RazorJamie Kayveon Lennartz LCSW [phone]

## 2015-09-10 NOTE — Progress Notes (Signed)
[facility] PROGRESS NOTE  Abigail Santiago QJJ:[id number] DOB: October 29, 1951 DOA: 09/07/2015 PCP: Tillie Fantasia, MD    HPI/Subjective: Seen with nursing staff at bedside, still short of breath. She still has some cough without marked sputum production. No fever or chills overnight. Given Lasix 3 times, appeared to have very good response with over 8.4 L urine output since last night.  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 Workup done in the ED including chest x-ray and CT scan of the chest was consistent with a multifocal pneumonia. Patient was admitted to the step down unit. Patient was placed empirically on IV vancomycin and IV Zosyn. Patient was also placed on Ativan withdrawal protocol. On admission patient was noted to be septic and placed on the sepsis protocol. Patient has been pancultured and results pending. Patient noted to have worsening shortness of breath with elevated blood pressure and clinical findings consistent with volume overload. Patient was placed on IV Lasix. Patient also on empiric IV antibiotics oxygen, nebulizer treatments, supportive care.  Assessment/Plan:  Multilobar healthcare associated pneumonia Patient had presented with fevers, shortness of breath, clinical findings consistent with a pneumonia Elevated lactic acid level, chest x-ray and CT findings concerning for multilobar pneumonia.  Blood cultures pending. Sputum Gram stain and culture pending.  Urine streptococcus antigen is negative. Urine Legionella antigen is negative.  On IV Zosyn, continue oxygen, Mucinex,  nebulizers and antitussives.  Sepsis likely secondary to healthcare associated pneumonia Patient met criteria for sepsis with tachycardia, tachypnea, borderline blood pressure in the presence of PNA. hypoxia when patient was first picked up by EMS with sats in the 60s to 70s. Lactic acid was also elevated. Hypoxia has improved. Lactic acid trended down.  Treat with IV antibiotics and aggressive hydration with IV fluids.  Acute hypoxic respiratory failure Oxygen saturation was in the 60s to 70s on admission, patient requires 3-3.5 L of oxygen to keep sats above 90%. This is likely secondary to multi lobar pneumonia, continue current treatment. Wean off of oxygen as she is improving.  Volume overload Patient with worsening shortness of breath overnight and this morning.  Diffuse bilateral crackles on examination, started on Lasix 40 mg IV twice a day. Had really good response with diuresis of 8.4 L and total negative of 6.9 L.  Hypokalemia/hypomagnesemia Repleted.  Elevated troponin Likely secondary to sepsis and current infection. Cardiac enzymes trending down.  2-D echo with EF of 55-60% with trivial aortic valvular regurgitation. No further workup needed at this time.   Iron deficiency anemia/microcytic anemia Patient with no overt bleeding. Anemia panel consistent with iron deficiency anemia with iron level of 8 and a ferritin of 153 with a folate of 31, B-12 = 304.Hemoglobin currently at 10,0 from 11.5 on admission. Partial dilutional effect. Patient has received IV iron dose. Will likely need to be on oral iron supplementation on discharge.  History of DVTs Pradaxa.  Gastroesophageal reflux disease PPI.  History of alcohol dependence Patient was at [name] for alcohol rehabilitation/[facility]ox prior to admission. Continue the Ativan withdrawal protocol. Continue thiamine, multivitamin, folic acid. Will likely need to go back to [facility] on discharge.  ??  Hyperlipidemia Fasting lipid panel not indicative of  hyperlipidemia.Total  cholesterol is 121. HDL is 84. LDL is 30. No need for any medications at this time. Outpatient follow-up.  Depression/general anxiety disorder Stable. Continue Cymbalta and BuSpar.  Cirrhosis of the liver per CT scan Will likely need outpatient follow-up. [facility].  Abnormal TSH Free T4 within normal limits. T3 pending. Likely euthyroid sick syndrome. Patient will likely need repeat thyroid function studies done in about 4-6 weeks.     Code Status: Full Family Communication: Updated patient. No family at bedside. Disposition Plan: Remain in the step down unit.   Consultants:  None  Procedures:  Chest x-ray 09/07/2015, 09/09/2015  CT chest 09/07/2015  2-D echo 09/08/2015  Antibiotics:  IV vancomycin 09/07/2015>>>> 09/09/2015  IV Zosyn 09/07/2015   Objective: Filed Vitals:   09/10/15 0600  BP: 128/72  Pulse: 100  Temp:   Resp: 20    Intake/Output Summary (Last 24 hours) at 09/10/15 0758 Last data filed at 09/10/15 0600  Gross per 24 hour  Intake   1500 ml  Output   8475 ml  Net  -6975 ml   Filed Weights   09/08/15 0600 09/09/15 1000 09/10/15 0131  Weight: 79.7 kg (175 lb 11.3 oz) 83.2 kg (183 lb 6.8 oz) 75.3 kg (166 lb 0.1 oz)    Exam:   General:  NAD.   Cardiovascular: Tachycardia  Respiratory: Diffuse coarse breath sounds with rhonchi. Diffuse crackles. Some use of accessory muscles of respiration.  Abdomen: Soft, nontender, nondistended, positive bowel sounds.  Musculoskeletal: No clubbing cyanosis or edema.  Data Reviewed: Basic Metabolic Panel:  Recent Labs Lab 09/04/15 1907 09/07/15 0850 09/07/15 0900 09/07/15 1600 09/08/15 0346 09/09/15 0354 09/10/15 0325  NA 139 141  --   --  139 136 138  K 4.5 3.2*  --   --  4.6 4.7 3.8  CL 99* 100*  --   --  107 105 98*  CO2 31 30  --   --  26 24 33*  GLUCOSE 85  243*  --   --  193* 129* 124*  BUN 21* 25*  --   --  21* 18 24*  CREATININE 1.20* 1.02*  --   --  1.02* 0.87 1.11*  CALCIUM 9.6 9.0  --   --  8.6* 8.7* 8.8*  MG  --   --  0.9* 1.0* 2.1 1.7 1.9   Liver Function Tests:  Recent Labs Lab 09/04/15 1907 09/07/15 0850  AST 72* 48*  ALT 40 35  ALKPHOS 99 86  BILITOT 0.4 0.6  PROT 6.1* 6.5  ALBUMIN 3.5 3.4*    Recent Labs Lab 09/07/15 0900  LIPASE 57*   No results for input(s): AMMONIA in the last 168 hours. CBC:  Recent Labs Lab 09/04/15 1907 09/07/15 0850 09/08/15 0346 09/09/15 0354 09/10/15 0325  WBC 3.8* 6.4 9.2 10.0 8.6  NEUTROABS 1.8 5.0 7.4  --  6.0  HGB 11.0* 11.5* 9.9* 10.0* 9.4*  HCT 33.5* 34.1* 30.0* 30.8* 28.3*  MCV 113.9* 111.8* 115.4* 115.4* 111.9*  PLT 161 162 144* 178 183   Cardiac Enzymes:  Recent Labs Lab 09/07/15 0908 09/07/15 1604 09/07/15 2126 09/08/15 0346  TROPONINI 0.24* 0.19* 0.09* 0.06*   BNP (last 3 results)  Recent Labs  09/07/15 0850  BNP 191.0*    ProBNP (last 3 results) No results for input(s): PROBNP in the last 8760 hours.  CBG: No results for input(s): GLUCAP in the last 168 hours.  Recent Results (from the past 240  hour(s))  Blood culture (routine x 2)     Status: None (Preliminary result)   Collection Time: 09/07/15 11:17 AM  Result Value Ref Range Status   Specimen Description BLOOD RIGHT HAND  Final   Special Requests BOTTLES DRAWN AEROBIC AND ANAEROBIC 5CC  Final   Culture   Final    NO GROWTH 2 DAYS Performed at [facility]    Report Status PENDING  Incomplete  Blood culture (routine x 2)     Status: None (Preliminary result)   Collection Time: 09/07/15 11:26 AM  Result Value Ref Range Status   Specimen Description BLOOD LEFT HAND  Final   Special Requests BOTTLES DRAWN AEROBIC AND ANAEROBIC 5CC  Final   Culture   Final    NO GROWTH 2 DAYS Performed at [facility]    Report Status PENDING  Incomplete  Urine culture     Status: None    Collection Time: 09/07/15  4:00 PM  Result Value Ref Range Status   Specimen Description URINE, CLEAN CATCH  Final   Special Requests NONE  Final   Culture   Final    NO GROWTH 1 DAY Performed at [facility]    Report Status 09/08/2015 FINAL  Final  MRSA PCR Screening     Status: None   Collection Time: 09/07/15  4:03 PM  Result Value Ref Range Status   MRSA by PCR NEGATIVE NEGATIVE Final    Comment:        The GeneXpert MRSA Assay (FDA approved for NASAL specimens only), is one component of a comprehensive MRSA colonization surveillance program. It is not intended to diagnose MRSA infection nor to guide or monitor treatment for MRSA infections.      Studies: Dg Chest Port 1 View  09/09/2015  CLINICAL DATA:  Shortness of breath . EXAM: PORTABLE CHEST 1 VIEW COMPARISON:  CT 09/07/2015.  Chest x-ray 09/07/2015. FINDINGS: Cardiomegaly diffuse bilateral from interstitial prominence particular the upper lobes consistent with congestive heart failure. Bilateral upper lobe pneumonia, right side greater than left cannot be excluded. Cavitary infiltrate left upper lobe again cannot be excluded. No prominent pleural effusion or pneumothorax. IMPRESSION: 1. Cardiomegaly with interim progression of diffuse bilateral pulmonary interstitial prominence consistent with congestive heart failure. 2. Bilateral upper lobe pulmonary infiltrates, right side greater than left. Pneumonia cannot be excluded. Cavitary infiltrate in the left upper lobe again cannot be excluded. Electronically Signed   By: Marcello Moores  Register   On: 09/09/2015 09:14    Scheduled Meds: . busPIRone  15 mg Oral TID  . dabigatran  150 mg Oral BID  . DULoxetine  60 mg Oral Daily  . folic acid  1 mg Oral Daily  . furosemide  40 mg Intravenous Q12H  . gabapentin  600 mg Oral TID  . guaiFENesin  1,200 mg Oral BID  . ipratropium  0.5 mg Nebulization QID  . levalbuterol  0.63 mg Nebulization QID  . lidocaine  1 patch  Transdermal Q24H  . loratadine  10 mg Oral Daily  . LORazepam  0-4 mg Intravenous Q12H  . magnesium oxide  400 mg Oral TID  . multivitamin with minerals  1 tablet Oral Daily  . pantoprazole  40 mg Oral Daily  . piperacillin-tazobactam (ZOSYN)  IV  3.375 g Intravenous Q8H  . thiamine  100 mg Oral Daily   Or  . thiamine  100 mg Intravenous Daily  . traZODone  50 mg Oral QHS   Continuous Infusions:  Principal Problem:   HCAP (healthcare-associated pneumonia) Active Problems:   Sepsis, unspecified organism ([place])   Alcohol dependence ([place])   Hypokalemia   Elevated troponin   Macrocytic anemia   Cirrhosis of liver ([place]): Per CT 09/07/2015   Depression   GAD (generalized anxiety disorder)   DVT (deep venous thrombosis) ([place]): hX OF MULTIPLE DVT PER PATIENT   Hyperlipidemia   GERD (gastroesophageal reflux disease)   Anemia, iron deficiency   Abnormal TSH   Volume overload    Time spent: 40 mins    [facility] A MD [facility] Pager [phone]. If 7PM-7AM, please contact night-coverage at [email or web address], password [facility] 09/10/2015, 7:58 AM  LOS: 3 days

## 2015-09-10 NOTE — Progress Notes (Signed)
RT came to assess patient for frequent desaturations. Patient was SOB and had increased WOB. No wheezing was noted and neb treatment is being administered to help decrease WOB. Patient is tachypneic and labored. RN notified.

## 2015-09-10 NOTE — Progress Notes (Signed)
Pt had increased WOB and SOB after PT visit; HR also increased.  MD made aware. EKG and CXR ordered and performed per MD.   Pt now stable, WOB/SOB has some what improved, HR still slightly elevated.   Will continue to monitor. 

## 2015-09-10 NOTE — NC FL2 (Deleted)
[place] MEDICAID FL2 LEVEL OF CARE SCREENING TOOL     IDENTIFICATION  Patient Name: Abigail Santiago Birthdate: 1951-07-08 Sex: female Admission Date (Current Location): 09/07/2015  [facility] and [place] Number:     Facility and Address:  [facility],  [address], [place] [address]      Provider Number: [id number]  Attending Physician Name and Address:  Clydia Llano, MD  Relative Name and Phone Number:       Current Level of Care: Hospital Recommended Level of Care: Skilled Nursing Facility Prior Approval Number:    Date Approved/Denied:   PASRR Number: [id number]  Discharge Plan: SNF    Current Diagnoses: Patient Active Problem List   Diagnosis Date Noted  . Volume overload 09/09/2015  . Anemia, iron deficiency 09/08/2015  . Abnormal TSH 09/08/2015  . HCAP (healthcare-associated pneumonia) 09/07/2015  . Sepsis, unspecified organism (HCC) 09/07/2015  . Alcohol dependence (HCC) 09/07/2015  . Hypokalemia 09/07/2015  . Elevated troponin 09/07/2015  . Macrocytic anemia 09/07/2015  . Cirrhosis of liver [facility]): Per CT 09/07/2015 09/07/2015  . Depression 09/07/2015  . GAD (generalized anxiety disorder) 09/07/2015  . DVT (deep venous thrombosis) (HCC): hX OF MULTIPLE DVT PER PATIENT 09/07/2015  . Hyperlipidemia 09/07/2015  . GERD (gastroesophageal reflux disease) 09/07/2015    Orientation ACTIVITIES/SOCIAL BLADDER RESPIRATION    Self, Time, Situation, Place    Continent O2 (As needed)  BEHAVIORAL SYMPTOMS/MOOD NEUROLOGICAL BOWEL NUTRITION STATUS   (no behaviors)   Continent Diet (Heart Healthy)  PHYSICIAN VISITS COMMUNICATION OF NEEDS Height & Weight Skin  Over 180 days Verbally  (162.6 cm) 138 lbs. Normal          AMBULATORY STATUS RESPIRATION     (limited assistance) O2 (As needed)      Personal Care Assistance Level of Assistance  Bathing Bathing Assistance: Limited assistance          Functional Limitations Info                 SPECIAL CARE FACTORS FREQUENCY  PT (By licensed PT)     PT Frequency: 6/7 x wkly             Additional Factors Info  Code Status (Full Code)               Current Medications (09/10/2015): Current Facility-Administered Medications  Medication Dose Route Frequency Provider Last Rate Last Dose  . benzocaine-menthol (CHLORAEPTIC) lozenge 1 lozenge  1 lozenge Oral PRN Rodolph Bong, MD      . benzonatate (TESSALON) capsule 100 mg  100 mg Oral TID PRN Rodolph Bong, MD      . busPIRone (BUSPAR) tablet 15 mg  15 mg Oral TID Rodolph Bong, MD   15 mg at 09/09/15 2110  . dabigatran (PRADAXA) capsule 150 mg  150 mg Oral BID Rodolph Bong, MD   150 mg at 09/10/15 0947  . dicyclomine (BENTYL) tablet 20 mg  20 mg Oral BID PRN Rodolph Bong, MD      . DULoxetine (CYMBALTA) DR capsule 60 mg  60 mg Oral Daily Rodolph Bong, MD   60 mg at 09/10/15 0947  . folic acid (FOLVITE) tablet 1 mg  1 mg Oral Daily Ramiro Harvest V, MD   1 mg at 09/10/15 0947  . furosemide (LASIX) injection 40 mg  40 mg Intravenous Q12H Rodolph Bong, MD   40 mg at 09/10/15 0526  . gabapentin (NEURONTIN) capsule 600 mg  600 mg Oral TID Rodolph Bong, MD   600 mg at 09/10/15 0947  . guaiFENesin (MUCINEX) 12 hr tablet 1,200 mg  1,200 mg Oral BID Rodolph Bong, MD   1,200 mg at 09/10/15 0947  . hydrALAZINE (APRESOLINE) injection 5 mg  5 mg Intravenous Q6H PRN Leda Gauze, NP   5 mg at 09/09/15 0021  . ibuprofen (ADVIL,MOTRIN) tablet 400 mg  400 mg Oral Q6H PRN Rodolph Bong, MD   400 mg at 09/09/15 2121  . ipratropium (ATROVENT) nebulizer solution 0.5 mg  0.5 mg Nebulization Q4H PRN Ramiro Harvest V, MD      . ipratropium (ATROVENT) nebulizer solution 0.5 mg  0.5 mg Nebulization QID Rodolph Bong, MD   0.5 mg at 09/10/15 0909  . levalbuterol (XOPENEX) nebulizer solution 0.63 mg  0.63 mg Nebulization Q6H PRN Rodolph Bong, MD      . levalbuterol Pauline Aus)  nebulizer solution 0.63 mg  0.63 mg Nebulization QID Rodolph Bong, MD   0.63 mg at 09/10/15 0910  . lidocaine (LIDODERM) 5 % 1 patch  1 patch Transdermal Q24H Rodolph Bong, MD   1 patch at 09/10/15 [phone]  . loperamide (IMODIUM) capsule 2 mg  2 mg Oral QID PRN Rodolph Bong, MD      . loratadine (CLARITIN) tablet 10 mg  10 mg Oral Daily Rodolph Bong, MD   10 mg at 09/10/15 0947  . LORazepam (ATIVAN) injection 0-4 mg  0-4 mg Intravenous Q12H Rodolph Bong, MD   0 mg at 09/09/15 1415  . LORazepam (ATIVAN) tablet 1 mg  1 mg Oral Q6H PRN Rodolph Bong, MD       Or  . LORazepam (ATIVAN) injection 1 mg  1 mg Intravenous Q6H PRN Rodolph Bong, MD   1 mg at 09/09/15 0050  . magnesium oxide (MAG-OX) tablet 400 mg  400 mg Oral TID Rodolph Bong, MD   400 mg at 09/10/15 0947  . methocarbamol (ROBAXIN) tablet 500 mg  500 mg Oral Q8H PRN Rodolph Bong, MD   500 mg at 09/09/15 0030  . multivitamin with minerals tablet 1 tablet  1 tablet Oral Daily Rodolph Bong, MD   1 tablet at 09/10/15 0947  . ondansetron (ZOFRAN) injection 4 mg  4 mg Intravenous Q6H PRN Rodolph Bong, MD   4 mg at 09/09/15 0053  . pantoprazole (PROTONIX) EC tablet 40 mg  40 mg Oral Daily Rodolph Bong, MD   40 mg at 09/10/15 0947  . piperacillin-tazobactam (ZOSYN) IVPB 3.375 g  3.375 g Intravenous Q8H Anh P Pham, RPH   3.375 g at 09/10/15 0948  . thiamine (VITAMIN B-1) tablet 100 mg  100 mg Oral Daily Ramiro Harvest V, MD   100 mg at 09/09/15 1050   Or  . thiamine (B-1) injection 100 mg  100 mg Intravenous Daily Rodolph Bong, MD   100 mg at 09/10/15 0948  . traZODone (DESYREL) tablet 50 mg  50 mg Oral QHS Rodolph Bong, MD   50 mg at 09/09/15 2110   Do not use this list as official medication orders. Please verify with discharge summary.  Discharge Medications:   Medication List    ASK your doctor about these medications        alum & mag hydroxide-simeth 200-200-20 MG/5ML  suspension  Commonly known as:  MAALOX/MYLANTA  Take 30 mLs by mouth every 6 (six) hours as  needed for indigestion or heartburn.     anti-nausea solution  Take 5-10 mLs by mouth every 15 (fifteen) minutes as needed for nausea or vomiting.     benzonatate 100 MG capsule  Commonly known as:  TESSALON  Take 100 mg by mouth 3 (three) times daily as needed for cough.     busPIRone 15 MG tablet  Commonly known as:  BUSPAR  Take 15 mg by mouth 3 (three) times daily.     CALCIUM + D PO  Take 1 tablet by mouth daily.     cetirizine 10 MG tablet  Commonly known as:  ZYRTEC  Take 10 mg by mouth daily as needed for allergies.     CHLORASEPTIC 6-10 MG lozenge  Generic drug:  benzocaine-menthol  Take 1 lozenge by mouth as needed for sore throat.     chlordiazePOXIDE 5 MG capsule  Commonly known as:  LIBRIUM  Take 5 mg by mouth 4 (four) times daily. For 4 doses.Discontinued 08/06/2015     diazepam 5 MG/ML injection  Commonly known as:  VALIUM  Inject 10 mg into the vein as directed. Stat at onset of seizures     dicyclomine 20 MG tablet  Commonly known as:  BENTYL  Take 20 mg by mouth 2 (two) times daily as needed for spasms.     DULoxetine 60 MG capsule  Commonly known as:  CYMBALTA  Take 60 mg by mouth daily.     gabapentin 600 MG tablet  Commonly known as:  NEURONTIN  Take 600 mg by mouth 3 (three) times daily.     guaiFENesin 600 MG 12 hr tablet  Commonly known as:  MUCINEX  Take 600 mg by mouth 2 (two) times daily.     ibuprofen 600 MG tablet  Commonly known as:  ADVIL,MOTRIN  Take 600 mg by mouth 4 (four) times daily as needed for moderate pain.     lidocaine 5 %  Commonly known as:  LIDODERM  Place 1 patch onto the skin daily. Remove & Discard patch within 12 hours or as directed by MD     lisinopril 20 MG tablet  Commonly known as:  PRINIVIL,ZESTRIL  Take 20 mg by mouth daily.     loperamide 2 MG tablet  Commonly known as:  IMODIUM A-D  Take 2 mg by mouth 4  (four) times daily as needed for diarrhea or loose stools.     magnesium oxide 400 MG tablet  Commonly known as:  MAG-OX  Take 400 mg by mouth 3 (three) times daily.     methocarbamol 500 MG tablet  Commonly known as:  ROBAXIN  Take 500 mg by mouth every 8 (eight) hours as needed for muscle spasms.     multivitamin with minerals Tabs tablet  Take 1 tablet by mouth daily.     ondansetron 8 MG tablet  Commonly known as:  ZOFRAN  Take 8 mg by mouth 2 (two) times daily as needed for nausea or vomiting.     pantoprazole 40 MG tablet  Commonly known as:  PROTONIX  Take 40 mg by mouth daily.     PRADAXA 150 MG Caps capsule  Generic drug:  dabigatran  Take 150 mg by mouth 2 (two) times daily.     thiamine 100 MG tablet  Take 100 mg by mouth daily.     traZODone 50 MG tablet  Commonly known as:  DESYREL  Take 50 mg by mouth at bedtime.  VITAMIN D PO  Take 1 capsule by mouth daily.        Relevant Imaging Results:  Relevant Lab Results:  Recent Labs    Additional Information SS # [id number]  Raheen Capili, Dickey GaveJamie Lee, LCSW

## 2015-09-10 NOTE — Progress Notes (Signed)
Pt and sister concerned about "new onset" expressive aphasia. Pt is stable with a little difficulty getting words out.  She states that she is having trouble thinking of the words she is trying to say, and she states her vision is more blurry than usual.  She has generalized weakness. MD was notified of concerns and observations.    We will continue to monitor pt.  Sharl MaBri Nai Dasch, RN

## 2015-09-11 DIAGNOSIS — K219 Gastro-esophageal reflux disease without esophagitis: Secondary | ICD-10-CM

## 2015-09-11 DIAGNOSIS — I82409 Acute embolism and thrombosis of unspecified deep veins of unspecified lower extremity: Secondary | ICD-10-CM

## 2015-09-11 DIAGNOSIS — E876 Hypokalemia: Secondary | ICD-10-CM

## 2015-09-11 LAB — IRON AND TIBC
Iron: 128 ug/dL (ref 28–170)
SATURATION RATIOS: 66 % — AB (ref 10.4–31.8)
TIBC: 193 ug/dL — AB (ref 250–450)
UIBC: 65 ug/dL

## 2015-09-11 LAB — T3, FREE: T3, Free: 1.9 pg/mL — ABNORMAL LOW (ref 2.0–4.4)

## 2015-09-11 MED ORDER — FUROSEMIDE 10 MG/ML IJ SOLN: 40.0000 mg | Freq: Two times a day (BID) | INTRAMUSCULAR | Status: DC

## 2015-09-11 MED ORDER — FUROSEMIDE 10 MG/ML IJ SOLN
40.0000 mg | Freq: Every day | INTRAMUSCULAR | Status: DC
  Administered 2015-09-12 – 2015-09-13 (×2): 40 mg via INTRAVENOUS
  Filled 2015-09-11 (×2): qty 4

## 2015-09-11 NOTE — Progress Notes (Signed)
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 Supine HR 114, o2 96% on 2L, RR 28, and BP 99/62.  requires min/mod assist with HHA and use of bed pad for supine > sit EOB; vitals seated EOB HR 118, o2 93% on 2L, RR 17 and BP 106/75. Ambulated with RW and min guard/min assist plus chair follow 35'; upon patient sitting, vitals were HR 122, o2 89% lowest during ambulation and BP 120/75, and 1 LOB posteriorly and laterally L upon turning to sit in recliner - pt. Stated due to back and L hip pain. Ambulated 35' back to room presenting with L hip hike/trendelenberg, decreased stride and velocity, and forward flexed posture.  Follow Up Recommendations  No PT follow up     Equipment Recommendations  None recommended by PT    Recommendations for Other Services       Precautions / Restrictions Precautions Precautions: Fall Precaution Comments: monitor sats Restrictions Weight Bearing Restrictions: No    Mobility  Bed Mobility Overal bed mobility: Needs Assistance Bed Mobility: Supine to Sit     Supine to sit: HOB elevated;Mod assist;Min assist     General bed mobility comments: increased time; HHA and use of bed pad to assist with scooting to EOB  Transfers Overall transfer level: Needs assistance Equipment used: Rolling walker (2 wheeled) Transfers: Sit to/from Stand Sit to Stand: Min assist;Mod assist;+2 safety/equipment         General transfer comment: +2 for safety and lines    Ambulation/Gait Ambulation/Gait assistance: Min assist;Min guard Ambulation Distance (Feet): 70 Feet (35 x 2) Assistive device: Rolling walker (2 wheeled) Gait Pattern/deviations: Step-to pattern;Trendelenburg;Trunk flexed Gait velocity: decreased    General Gait Details: ambulated 10535ft x 2 with RW and min guard/min assist; c/o back and L hip pain with 1 LOB posteriorly and laterally L at 35' upon turning to sit in recliner; presents with flexed trunk, L hip hike/trendelenberg, step to pattern and decreased stride length    Stairs            Wheelchair Mobility    Modified Rankin (Stroke Patients Only)       Balance                                    Cognition Arousal/Alertness: Awake/alert Behavior During Therapy: WFL for tasks assessed/performed Overall Cognitive Status: Within Functional Limits for tasks assessed                      Exercises      General Comments        Pertinent Vitals/Pain Pain Assessment: Faces Faces Pain Scale: Hurts little more Pain Location: L hip and low back  Pain Intervention(s): Limited activity within patient's tolerance;Monitored during session;Repositioned    Home Living                      Prior Function            PT  Goals (current goals can now be found in the care plan section) Acute Rehab PT Goals Time For Goal Achievement: 09/15/15 Potential to Achieve Goals: Good Progress towards PT goals: Progressing toward goals    Frequency  Min 3X/week    PT Plan      Co-evaluation             End of Session Equipment Utilized During Treatment: Gait belt Activity Tolerance: Patient tolerated treatment well;Patient limited by pain;Patient limited by fatigue Patient left: in chair;with call bell/phone within reach;with chair alarm set;with nursing/sitter in room     Time: 0865-7846 PT Time Calculation (min) (ACUTE ONLY): 40 min  Charges:  $Gait Training: 23-37  mins $Therapeutic Activity: 8-22 mins                    G CodesMarin Comment, SPTA   09/11/2015 1:07 PM   Pager: [phone]   Reviewed and agree with above Felecia Shelling  PTA [facility]  Acute  Rehab Pager      [phone]

## 2015-09-11 NOTE — Plan of Care (Signed)
Problem: ICU Phase Progression Outcomes Goal: O2 sats trending toward baseline Outcome: Progressing Now 94% on 2L nasal cannula. Will continue to wean oxygen as indicated.

## 2015-09-11 NOTE — Progress Notes (Signed)
Patient assessed per RT protocol assessment. Treatments will remain xopenex and atrovent QID. Patient has pna with diminished BBS. Patient becomes SOB with activity and complains of chest pain with deep breathing. MD is aware of chest pain. Patient also states treatments help her. RT will continue to monitor patient.

## 2015-09-11 NOTE — Progress Notes (Signed)
ANTIBIOTIC CONSULT NOTE   Pharmacy Consult for Zosyn Indication:  PNA  No Known Allergies  Patient Measurements: Height:  (160 cm) Weight: 164 lb 14.5 oz (74.8 kg) IBW/kg (Calculated) : 52.4  Vital Signs: Temp: 98 F (36.7 C) (11/10 0800) Temp Source: Oral (11/10 0800) BP: 107/66 mmHg (11/10 0811) Pulse Rate: 109 (11/10 0811) Intake/Output from previous day: 11/09 0701 - 11/10 0700 In: 960 [P.O.:720; I.V.:140; IV Piggyback:100] Out: 4850 [Urine:4850] Intake/Output from this shift: Total I/O In: 50 [IV Piggyback:50] Out: 135 [Urine:135]  Labs:  Recent Labs  09/09/15 0354 09/10/15 0325  WBC 10.0 8.6  HGB 10.0* 9.4*  PLT 178 183  CREATININE 0.87 1.11*   Estimated Creatinine Clearance: 50.3 mL/min (by C-G formula based on Cr of 1.11). No results for input(s): VANCOTROUGH, VANCOPEAK, VANCORANDOM, GENTTROUGH, GENTPEAK, GENTRANDOM, TOBRATROUGH, TOBRAPEAK, TOBRARND, AMIKACINPEAK, AMIKACINTROU, AMIKACIN in the last 72 hours.   Microbiology: Recent Results (from the past 720 hour(s))  Blood culture (routine x 2)     Status: None (Preliminary result)   Collection Time: 09/07/15 11:17 AM  Result Value Ref Range Status   Specimen Description BLOOD RIGHT HAND  Final   Special Requests BOTTLES DRAWN AEROBIC AND ANAEROBIC 5CC  Final   Culture   Final    NO GROWTH 3 DAYS Performed at [facility]    Report Status PENDING  Incomplete  Blood culture (routine x 2)     Status: None (Preliminary result)   Collection Time: 09/07/15 11:26 AM  Result Value Ref Range Status   Specimen Description BLOOD LEFT HAND  Final   Special Requests BOTTLES DRAWN AEROBIC AND ANAEROBIC 5CC  Final   Culture   Final    NO GROWTH 3 DAYS Performed at [facility]    Report Status PENDING  Incomplete  Urine culture     Status: None   Collection Time: 09/07/15  4:00 PM  Result Value Ref Range Status   Specimen Description URINE, CLEAN CATCH  Final   Special Requests NONE   Final   Culture   Final    NO GROWTH 1 DAY Performed at [facility]    Report Status 09/08/2015 FINAL  Final  MRSA PCR Screening     Status: None   Collection Time: 09/07/15  4:03 PM  Result Value Ref Range Status   MRSA by PCR NEGATIVE NEGATIVE Final    Comment:        The GeneXpert MRSA Assay (FDA approved for NASAL specimens only), is one component of a comprehensive MRSA colonization surveillance program. It is not intended to diagnose MRSA infection nor to guide or monitor treatment for MRSA infections.     Medical History: Past Medical History  Diagnosis Date  . Hypertension   . Pancreatitis     x3  . Depression   . Alcohol dependence (HCC) 09/07/2015  . Macrocytic anemia 09/07/2015  . Cirrhosis of liver [facility]): Per CT 09/07/2015 09/07/2015  . GAD (generalized anxiety disorder) 09/07/2015  . DVT (deep venous thrombosis) (HCC): hX OF MULTIPLE DVT PER PATIENT 09/07/2015  . Hyperlipidemia 09/07/2015  . GERD (gastroesophageal reflux disease) 09/07/2015  . Anemia, iron deficiency 09/08/2015   Assessment: Patient is a 64 y.o F [facility] with c/o SOB.  She was found to have elevated LA and Chest CT showed severe multilobe PNA.  Staredt broad abx with vancomycin and zosyn for r/o sepsis/PNA.    Goal of Therapy:  Antibiotic at dosage appropriate for renal function; eradication of infection   Today: D6 Zosyn 3.375 grams IV q8h (extended-infusion over 4 hours) [facility] Afebrile WBC WNL CXR improving Urine culture NGF; blood culture no growth to date  Plan: 1. Continue current Zosyn dosage. 2. Await word on planned duration of therapy.  Elie Goodyandy Launi Asencio, PharmD, BCPS Pager: [phone] 09/11/2015  9:42 AM

## 2015-09-11 NOTE — Plan of Care (Signed)
Problem: Phase I Progression Outcomes Goal: Progress activity as tolerated unless otherwise ordered Outcome: Progressing PT ambulated pt ~30 ft in hall with walker. Tolerated fair, sat down to rest halfway through. Pt reports more energy and strength than yesterday. Will continue to monitor.

## 2015-09-11 NOTE — Progress Notes (Signed)
[facility] PROGRESS NOTE  Markitta Ausburn IRW:[id number] DOB: 19-Mar-1951 DOA: 09/07/2015 PCP: Tillie Fantasia, MD    HPI/Subjective: Feels much better, on 3 L of oxygen sats in the mid 90s. Yesterday he had some shortness of breath and tachycardia after working with PT. We'll transfer to telemetry, continue current management.  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 Workup done in the ED including chest x-ray and CT scan of the chest was consistent with a multifocal pneumonia. Patient was admitted to the step down unit. Patient was placed empirically on IV vancomycin and IV Zosyn. Patient was also placed on Ativan withdrawal protocol. On admission patient was noted to be septic and placed on the sepsis protocol. Patient has been pancultured and results pending. Patient noted to have worsening shortness of breath with elevated blood pressure and clinical findings consistent with volume overload. Patient was placed on IV Lasix. Patient also on empiric IV antibiotics oxygen, nebulizer treatments, supportive care.  Assessment/Plan:  Multilobar healthcare associated pneumonia Patient had presented with fevers, shortness of breath, clinical findings consistent with a pneumonia Elevated lactic acid level, chest x-ray and CT findings concerning for multilobar pneumonia.  Blood cultures pending. Sputum Gram stain and culture pending.  Urine streptococcus antigen is negative. Urine Legionella antigen is negative.  On IV Zosyn, continue oxygen, Mucinex, nebulizers and antitussives. Increase ambulation  Sepsis  likely secondary to healthcare associated pneumonia Patient met criteria for sepsis with tachycardia, tachypnea, borderline blood pressure in the presence of PNA. hypoxia when patient was first picked up by EMS with sats in the 60s to 70s. Lactic acid was also elevated. Hypoxia has improved. Lactic acid trended down.  Treat with IV antibiotics and aggressive hydration with IV fluids. Currently off of IV fluids, on IV Lasix.  Acute hypoxic respiratory failure Oxygen saturation was in the 60s to 70s on admission, patient requires 3-3.5 L of oxygen to keep sats above 90%. This is likely secondary to multi lobar pneumonia, CT scan showed no evidence of PE, she is on PRADAXA as well. Wean off of oxygen as she is improving.  Volume overload Patient had worsening of her SOB after admission which is likely secondary to iatrogenic fluid overload. Diffuse bilateral crackles on examination, started on Lasix 40 mg IV twice a day. Had a very good response with diuresis of 8.4 L and total negative of 6.9 L. Continue Lasix 40 mg daily repeat x-ray in a day or 2.  Hypokalemia/hypomagnesemia Repleted.  Elevated troponin Likely secondary to sepsis and current infection. Cardiac enzymes trending down.  2-D echo with EF of 55-60% with trivial aortic valvular regurgitation. No further workup needed at this time.   Iron deficiency anemia/microcytic anemia Patient with no overt bleeding. Anemia panel consistent with iron deficiency anemia with iron level of 8 and a ferritin of 153 with a folate of 31, B-12 = 304.Hemoglobin currently at 10,0 from 11.5 on admission. Partial dilutional effect. Patient has received IV iron dose. Will likely need to be on oral iron supplementation on discharge.  History of DVTs Pradaxa.  Gastroesophageal reflux disease PPI.  History of alcohol dependence Patient was at [name] for alcohol rehabilitation/detox prior to admission. Continue the Ativan withdrawal protocol.  Continue thiamine, multivitamin, folic acid. Will likely need to go back to [facility] on discharge.  ?? Hyperlipidemia Fasting lipid panel not indicative of  hyperlipidemia.Total cholesterol is 121. HDL is 84. LDL is 30. No need for any medications at this time. Outpatient follow-up.  Depression/general anxiety disorder Stable. Continue Cymbalta and BuSpar.  Cirrhosis of the liver per CT scan Will likely need outpatient follow-up. [facility].  Abnormal TSH Free T4 within normal limits. T3 pending. Likely euthyroid sick syndrome. Patient will likely need repeat thyroid function studies done in about 4-6 weeks.     Code Status: Full Family Communication: Updated patient. No family at bedside. Disposition Plan: Remain in the step down unit.   Consultants:  None  Procedures:  Chest x-ray 09/07/2015, 09/09/2015  CT chest 09/07/2015  2-D echo 09/08/2015  Antibiotics:  IV vancomycin 09/07/2015>>>> 09/09/2015  IV Zosyn 09/07/2015   Objective: Filed Vitals:   09/11/15 0811  BP: 107/66  Pulse: 109  Temp:   Resp: 24    Intake/Output Summary (Last 24 hours) at 09/11/15 1109 Last data filed at 09/11/15 0911  Gross per 24 hour  Intake    700 ml  Output   3935 ml  Net  -3235 ml   Filed Weights   09/09/15 1000 09/10/15 0131 09/11/15 0500  Weight: 83.2 kg (183 lb 6.8 oz) 75.3 kg (166 lb 0.1 oz) 74.8 kg (164 lb 14.5 oz)    Exam:   General:  NAD.   Cardiovascular: Tachycardia  Respiratory: Diffuse coarse breath sounds with rhonchi. Diffuse crackles. Some use of accessory muscles of respiration.  Abdomen: Soft, nontender, nondistended, positive bowel sounds.  Musculoskeletal: No clubbing cyanosis or edema.  Data Reviewed: Basic Metabolic Panel:  Recent Labs Lab 09/04/15 1907 09/07/15 0850 09/07/15 0900 09/07/15 1600 09/08/15 0346 09/09/15 0354 09/10/15 0325  NA 139 141  --   --  139 136 138  K 4.5  3.2*  --   --  4.6 4.7 3.8  CL 99* 100*  --   --  107 105 98*  CO2 31 30  --   --  26 24 33*  GLUCOSE 85 243*  --   --  193* 129* 124*  BUN 21* 25*  --   --  21* 18 24*  CREATININE 1.20* 1.02*  --   --  1.02* 0.87 1.11*  CALCIUM 9.6 9.0  --   --  8.6* 8.7* 8.8*  MG  --   --  0.9* 1.0* 2.1 1.7 1.9   Liver Function Tests:  Recent Labs Lab 09/04/15 1907 09/07/15 0850  AST 72* 48*  ALT 40 35  ALKPHOS 99 86  BILITOT 0.4 0.6  PROT 6.1* 6.5  ALBUMIN 3.5 3.4*    Recent Labs Lab 09/07/15 0900  LIPASE 57*   No results for input(s): AMMONIA in the last 168 hours. CBC:  Recent Labs Lab 09/04/15 1907 09/07/15 0850 09/08/15 0346 09/09/15 0354 09/10/15 0325  WBC 3.8* 6.4 9.2 10.0 8.6  NEUTROABS 1.8 5.0 7.4  --  6.0  HGB 11.0* 11.5* 9.9* 10.0* 9.4*  HCT 33.5* 34.1* 30.0* 30.8* 28.3*  MCV 113.9* 111.8* 115.4* 115.4* 111.9*  PLT 161 162 144* 178 183   Cardiac Enzymes:  Recent Labs Lab 09/07/15 0908 09/07/15 1604 09/07/15 2126 09/08/15 0346  TROPONINI 0.24* 0.19* 0.09* 0.06*   BNP (last 3 results)  Recent Labs  09/07/15 0850  BNP 191.0*    ProBNP (last 3 results) No  results for input(s): PROBNP in the last 8760 hours.  CBG: No results for input(s): GLUCAP in the last 168 hours.  Recent Results (from the past 240 hour(s))  Blood culture (routine x 2)     Status: None (Preliminary result)   Collection Time: 09/07/15 11:17 AM  Result Value Ref Range Status   Specimen Description BLOOD RIGHT HAND  Final   Special Requests BOTTLES DRAWN AEROBIC AND ANAEROBIC 5CC  Final   Culture   Final    NO GROWTH 3 DAYS Performed at [facility]    Report Status PENDING  Incomplete  Blood culture (routine x 2)     Status: None (Preliminary result)   Collection Time: 09/07/15 11:26 AM  Result Value Ref Range Status   Specimen Description BLOOD LEFT HAND  Final   Special Requests BOTTLES DRAWN AEROBIC AND ANAEROBIC 5CC  Final   Culture   Final    NO GROWTH 3  DAYS Performed at [facility]    Report Status PENDING  Incomplete  Urine culture     Status: None   Collection Time: 09/07/15  4:00 PM  Result Value Ref Range Status   Specimen Description URINE, CLEAN CATCH  Final   Special Requests NONE  Final   Culture   Final    NO GROWTH 1 DAY Performed at [facility]    Report Status 09/08/2015 FINAL  Final  MRSA PCR Screening     Status: None   Collection Time: 09/07/15  4:03 PM  Result Value Ref Range Status   MRSA by PCR NEGATIVE NEGATIVE Final    Comment:        The GeneXpert MRSA Assay (FDA approved for NASAL specimens only), is one component of a comprehensive MRSA colonization surveillance program. It is not intended to diagnose MRSA infection nor to guide or monitor treatment for MRSA infections.      Studies: Dg Chest Port 1 View  09/10/2015  CLINICAL DATA:  Shortness of breath and hypoxia EXAM: PORTABLE CHEST - 1 VIEW COMPARISON:  09/09/2015 FINDINGS: Cardiac shadow is within normal limits. Vascular congestion is again identified although the degree of congestion and edema it is improved when compare with the prior exam. Persistent upper lobe infiltrates are seen but mildly improved when compare with the prior study. No new focal infiltrate is seen. IMPRESSION: Overall improvement in vascular congestion and interstitial edema as well as bilateral upper lobe infiltrates. Continued followup is recommended. Electronically Signed   By: Inez Catalina M.D.   On: 09/10/2015 16:08    Scheduled Meds: . busPIRone  15 mg Oral TID  . dabigatran  150 mg Oral BID  . DULoxetine  60 mg Oral Daily  . folic acid  1 mg Oral Daily  . [START ON 09/12/2015] furosemide  40 mg Intravenous Daily  . gabapentin  600 mg Oral TID  . guaiFENesin  1,200 mg Oral BID  . ipratropium  0.5 mg Nebulization QID  . iron polysaccharides  150 mg Oral Daily  . levalbuterol  0.63 mg Nebulization QID  . lidocaine  1 patch Transdermal Q24H  .  loratadine  10 mg Oral Daily  . LORazepam  0-4 mg Intravenous Q12H  . magnesium oxide  400 mg Oral TID  . multivitamin with minerals  1 tablet Oral Daily  . pantoprazole  40 mg Oral Daily  . piperacillin-tazobactam (ZOSYN)  IV  3.375 g Intravenous Q8H  . thiamine  100 mg Oral Daily  Or  . thiamine  100 mg Intravenous Daily  . traZODone  50 mg Oral QHS   Continuous Infusions:    Principal Problem:   HCAP (healthcare-associated pneumonia) Active Problems:   Sepsis, unspecified organism ([place])   Alcohol dependence ([place])   Hypokalemia   Elevated troponin   Macrocytic anemia   Cirrhosis of liver ([place]): Per CT 09/07/2015   Depression   GAD (generalized anxiety disorder)   DVT (deep venous thrombosis) ([place]): hX OF MULTIPLE DVT PER PATIENT   Hyperlipidemia   GERD (gastroesophageal reflux disease)   Anemia, iron deficiency   Abnormal TSH   Volume overload    Time spent: 40 mins    [facility] A MD [facility] Pager [phone]. If 7PM-7AM, please contact night-coverage at [email or web address], password [facility] 09/11/2015, 11:09 AM  LOS: 4 days

## 2015-09-11 NOTE — Progress Notes (Signed)
CSW assisting with d/c planning. PN reviewed. [facility]. Pt will need to be medically stable and  weaned off Oxygen prior [facility].  Cori RazorJamie Dailan Pfalzgraf LCSW [phone]

## 2015-09-12 DIAGNOSIS — K7030 Alcoholic cirrhosis of liver without ascites: Secondary | ICD-10-CM

## 2015-09-12 DIAGNOSIS — F411 Generalized anxiety disorder: Secondary | ICD-10-CM

## 2015-09-12 LAB — CULTURE, BLOOD (ROUTINE X 2)
CULTURE: NO GROWTH
Culture: NO GROWTH

## 2015-09-12 LAB — RENAL FUNCTION PANEL
ANION GAP: 9 (ref 5–15)
Albumin: 2.8 g/dL — ABNORMAL LOW (ref 3.5–5.0)
BUN: 29 mg/dL — ABNORMAL HIGH (ref 6–20)
CHLORIDE: 95 mmol/L — AB (ref 101–111)
CO2: 32 mmol/L (ref 22–32)
Calcium: 9.1 mg/dL (ref 8.9–10.3)
Creatinine, Ser: 0.95 mg/dL (ref 0.44–1.00)
GFR calc non Af Amer: >60 mL/min (ref >60)
GLUCOSE: 150 mg/dL — AB (ref 65–99)
Phosphorus: 3.8 mg/dL (ref 2.5–4.6)
Potassium: 3.4 mmol/L — ABNORMAL LOW (ref 3.5–5.1)
Sodium: 136 mmol/L (ref 135–145)

## 2015-09-12 MED ORDER — POTASSIUM CHLORIDE CRYS ER 20 MEQ PO TBCR
40.0000 meq | EXTENDED_RELEASE_TABLET | Freq: Four times a day (QID) | ORAL | Status: AC
  Administered 2015-09-12 (×2): 40 meq via ORAL
  Filled 2015-09-12 (×2): qty 2

## 2015-09-12 MED ORDER — MAGNESIUM SULFATE 2 GM/50ML IV SOLN
2.0000 g | Freq: Once | INTRAVENOUS | Status: AC
  Administered 2015-09-12: 2 g via INTRAVENOUS
  Filled 2015-09-12: qty 50

## 2015-09-12 MED ORDER — ACETAMINOPHEN 500 MG PO TABS
500.0000 mg | ORAL_TABLET | Freq: Once | ORAL | Status: AC
  Administered 2015-09-12: 500 mg via ORAL
  Filled 2015-09-12: qty 1

## 2015-09-12 MED ORDER — ACETAMINOPHEN 325 MG PO TABS
650.0000 mg | ORAL_TABLET | Freq: Once | ORAL | Status: AC
  Administered 2015-09-12: 650 mg via ORAL
  Filled 2015-09-12: qty 2

## 2015-09-12 NOTE — Progress Notes (Signed)
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 [facility] acute [facility]c.   Follow Up Recommendations  No OT follow up;Supervision - Intermittent    Equipment Recommendations  None recommended by OT    Recommendations for Other Services  None at this time   Precautions / Restrictions Precautions Precautions: Fall Precaution Comments: monitor sats Restrictions Weight Bearing Restrictions: No    Mobility Bed Mobility Overal bed mobility: Needs Assistance Bed Mobility: Supine to Sit     Supine to sit: HOB elevated;Supervision     General bed mobility comments: Use of bed rails, supervision for safety  Transfers Overall transfer level: Needs assistance Equipment used: Rolling walker (2 wheeled) Transfers: Sit to/from Stand Sit to Stand: Min guard Stand pivot transfers: Min guard       General transfer comment: Cues for hand placement and overall safety using RW for transfers.     Balance Overall balance assessment: Needs assistance Sitting-balance support: No upper extremity supported;Feet supported Sitting balance-Leahy Scale: Good     Standing balance support: Bilateral upper extremity supported;During functional activity Standing balance-Leahy Scale: Fair   ADL Overall ADL's : Needs assistance/impaired General ADL Comments: Pt on 2L/min supplemental 02 vis [place]. Pt in bed and willing to work with therapist. Pt engaged  in bed mobility, sat EOB, then stood with RW. Pt ambulated a few feet to sink. Pt stood for grooming task of brushing teeth. From here pt sat in recliner. Sats checked=83%. Therapist increased supplemental 02 to 3L/min. Sats increased to 99%. Notified RN of 02 sats. Administerred shampoo cap per patient's request. Encouraged pt to walk with RN around hallway today if PT did not see her.      Cognition   Behavior During Therapy: WFL for tasks assessed/performed Overall Cognitive Status: Within Functional Limits for tasks assessed                 Pertinent Vitals/ Pain       Pain Assessment: No/denies pain   Frequency Min 2X/week     Progress Toward Goals  OT Goals(current goals can now befound in the care plan section)  Progress towards OT goals: Progressing toward goals     Plan Discharge plan remains appropriate    End of Session Equipment Utilized During Treatment: Rolling walker;Oxygen   Activity Tolerance Patient tolerated treatment well   Patient Left in chair;with call bell/phone within reach;with chair alarm set  Nurse Communication Mobility status     Time: [id number] OT Time Calculation (min): 23 min  Charges: OT General Charges $OT Visit: 1 Procedure OT Treatments $Self Care/Home Management : 23-37 mins  Abigail Santiago , MS, OTR/L, CLT Pager: [phone]  09/12/2015, 3:59 PM

## 2015-09-12 NOTE — Progress Notes (Signed)
[facility] PROGRESS NOTE  Abigail Santiago DXA:[id number] DOB: 05-03-1951 DOA: 09/07/2015 PCP: Tillie Fantasia, MD    HPI/Subjective: Transferred out of the ICU yesterday, down to 2 L of oxygen. Walked around with PT yesterday, continue current management.  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 Workup done in the ED including chest x-ray and CT scan of the chest was consistent with a multifocal pneumonia. Patient was admitted to the step down unit. Patient was placed empirically on IV vancomycin and IV Zosyn. Patient was also placed on Ativan withdrawal protocol. On admission patient was noted to be septic and placed on the sepsis protocol. Patient has been pancultured and results pending. Patient noted to have worsening shortness of breath with elevated blood pressure and clinical findings consistent with volume overload. Patient was placed on IV Lasix. Patient also on empiric IV antibiotics oxygen, nebulizer treatments, supportive care.  Assessment/Plan:  Multilobar healthcare associated pneumonia Patient had presented with fevers, shortness of breath, clinical findings consistent with a pneumonia Elevated lactic acid level, chest x-ray and CT findings concerning for multilobar pneumonia.  Blood cultures pending. Sputum Gram stain and culture pending.  Urine streptococcus antigen is negative. Urine Legionella antigen is negative.  On IV Zosyn, continue oxygen, Mucinex, nebulizers and antitussives. Increase ambulation and hallways, hopefully oxygen requirements will decrease.  Sepsis likely  secondary to healthcare associated pneumonia Patient met criteria for sepsis with tachycardia, tachypnea, borderline blood pressure in the presence of PNA. hypoxia when patient was first picked up by EMS with sats in the 60s to 70s. Lactic acid was also elevated. Hypoxia has improved. Lactic acid trended down.  Treat with IV antibiotics and aggressive hydration with IV fluids. Currently off of IV fluids, on IV Lasix.  Acute hypoxic respiratory failure Oxygen saturation was in the 60s to 70s on admission, patient requires 3-3.5 L of oxygen to keep sats above 90%. This is likely secondary to multi lobar pneumonia, CT scan showed no evidence of PE, she is on PRADAXA as well. Wean off of oxygen as she is improving.  Volume overload Patient had worsening of her SOB after admission which is likely secondary to iatrogenic fluid overload. Diffuse bilateral crackles on examination, started on Lasix 40 mg IV twice a day. Had a very good response with diuresis of 8.4 L and total negative of 6.9 L. Continue Lasix 40 mg daily, check x-ray in a.m.  Hypokalemia/hypomagnesemia Repleted, secondary to diuresis.  Elevated troponin Likely secondary to sepsis and current infection. Cardiac enzymes trending down.  2-D echo with EF of 55-60% with trivial aortic valvular regurgitation. No further workup needed at this time.   Iron deficiency anemia/microcytic anemia Patient with no overt bleeding. Anemia panel consistent with iron deficiency anemia with iron level of 8 and a ferritin of 153 with a folate of 31, B-12 = 304.Hemoglobin currently at 10,0 from 11.5 on admission. Partial dilutional effect. Patient has received IV iron dose. Will likely need to be on oral iron supplementation on discharge.  History of DVTs Pradaxa.  Gastroesophageal reflux disease PPI.  History of alcohol dependence Patient was at [name] for alcohol rehabilitation/detox prior to admission. Continue the Ativan withdrawal  protocol. Continue thiamine, multivitamin, folic acid.  Will likely need to go back to fellowship [facility] on discharge.  ?? Hyperlipidemia Fasting lipid panel not indicative of  hyperlipidemia.Total cholesterol is 121. HDL is 84. LDL is 30. No need for any medications at this time. Outpatient follow-up.  Depression/general anxiety disorder Stable. Continue Cymbalta and BuSpar.  Cirrhosis of the liver per CT scan Will likely need outpatient follow-up. [facility].  Abnormal TSH Free T4 within normal limits. T3 pending. Likely euthyroid sick syndrome. Patient will likely need repeat thyroid function studies done in about 4-6 weeks.     Code Status: Full Family Communication: Updated patient. No family at bedside. Disposition Plan: Remain in the step down unit.   Consultants:  None  Procedures:  Chest x-ray 09/07/2015, 09/09/2015  CT chest 09/07/2015  2-D echo 09/08/2015  Antibiotics:  IV vancomycin 09/07/2015>>>> 09/09/2015  IV Zosyn 09/07/2015   Objective: Filed Vitals:   09/12/15 1215  BP: 119/80  Pulse: 108  Temp: 98.5 F (36.9 C)  Resp: 20    Intake/Output Summary (Last 24 hours) at 09/12/15 1219 Last data filed at 09/12/15 1100  Gross per 24 hour  Intake    770 ml  Output   3040 ml  Net  -2270 ml   Filed Weights   09/10/15 0131 09/11/15 0500 09/12/15 0510  Weight: 75.3 kg (166 lb 0.1 oz) 74.8 kg (164 lb 14.5 oz) 73.437 kg (161 lb 14.4 oz)    Exam:   General:  NAD.   Cardiovascular: Tachycardia  Respiratory: Diffuse coarse breath sounds with rhonchi. Diffuse crackles. Some use of accessory muscles of respiration.  Abdomen: Soft, nontender, nondistended, positive bowel sounds.  Musculoskeletal: No clubbing cyanosis or edema.  Data Reviewed: Basic Metabolic Panel:  Recent Labs Lab 09/07/15 0850 09/07/15 0900 09/07/15 1600 09/08/15 0346 09/09/15 0354 09/10/15 0325 09/12/15 0515  NA 141  --    --  139 136 138 136  K 3.2*  --   --  4.6 4.7 3.8 3.4*  CL 100*  --   --  107 105 98* 95*  CO2 30  --   --  26 24 33* 32  GLUCOSE 243*  --   --  193* 129* 124* 150*  BUN 25*  --   --  21* 18 24* 29*  CREATININE 1.02*  --   --  1.02* 0.87 1.11* 0.95  CALCIUM 9.0  --   --  8.6* 8.7* 8.8* 9.1  MG  --  0.9* 1.0* 2.1 1.7 1.9  --   PHOS  --   --   --   --   --   --  3.8   Liver Function Tests:  Recent Labs Lab 09/07/15 0850 09/12/15 0515  AST 48*  --   ALT 35  --   ALKPHOS 86  --   BILITOT 0.6  --   PROT 6.5  --   ALBUMIN 3.4* 2.8*    Recent Labs Lab 09/07/15 0900  LIPASE 57*   No results for input(s): AMMONIA in the last 168 hours. CBC:  Recent Labs Lab 09/07/15 0850 09/08/15 0346 09/09/15 0354 09/10/15 0325  WBC 6.4 9.2 10.0 8.6  NEUTROABS 5.0 7.4  --  6.0  HGB 11.5* 9.9* 10.0* 9.4*  HCT 34.1* 30.0* 30.8* 28.3*  MCV 111.8* 115.4* 115.4* 111.9*  PLT 162 144* 178 183   Cardiac Enzymes:  Recent Labs Lab 09/07/15 0908 09/07/15 1604 09/07/15 2126 09/08/15 0346  TROPONINI 0.24* 0.19* 0.09* 0.06*  BNP (last 3 results)  Recent Labs  09/07/15 0850  BNP 191.0*    ProBNP (last 3 results) No results for input(s): PROBNP in the last 8760 hours.  CBG: No results for input(s): GLUCAP in the last 168 hours.  Recent Results (from the past 240 hour(s))  Blood culture (routine x 2)     Status: None (Preliminary result)   Collection Time: 09/07/15 11:17 AM  Result Value Ref Range Status   Specimen Description BLOOD RIGHT HAND  Final   Special Requests BOTTLES DRAWN AEROBIC AND ANAEROBIC 5CC  Final   Culture   Final    NO GROWTH 4 DAYS Performed at [facility]    Report Status PENDING  Incomplete  Blood culture (routine x 2)     Status: None (Preliminary result)   Collection Time: 09/07/15 11:26 AM  Result Value Ref Range Status   Specimen Description BLOOD LEFT HAND  Final   Special Requests BOTTLES DRAWN AEROBIC AND ANAEROBIC 5CC  Final    Culture   Final    NO GROWTH 4 DAYS Performed at [facility]    Report Status PENDING  Incomplete  Urine culture     Status: None   Collection Time: 09/07/15  4:00 PM  Result Value Ref Range Status   Specimen Description URINE, CLEAN CATCH  Final   Special Requests NONE  Final   Culture   Final    NO GROWTH 1 DAY Performed at [facility]    Report Status 09/08/2015 FINAL  Final  MRSA PCR Screening     Status: None   Collection Time: 09/07/15  4:03 PM  Result Value Ref Range Status   MRSA by PCR NEGATIVE NEGATIVE Final    Comment:        The GeneXpert MRSA Assay (FDA approved for NASAL specimens only), is one component of a comprehensive MRSA colonization surveillance program. It is not intended to diagnose MRSA infection nor to guide or monitor treatment for MRSA infections.      Studies: Dg Chest Port 1 View  09/10/2015  CLINICAL DATA:  Shortness of breath and hypoxia EXAM: PORTABLE CHEST - 1 VIEW COMPARISON:  09/09/2015 FINDINGS: Cardiac shadow is within normal limits. Vascular congestion is again identified although the degree of congestion and edema it is improved when compare with the prior exam. Persistent upper lobe infiltrates are seen but mildly improved when compare with the prior study. No new focal infiltrate is seen. IMPRESSION: Overall improvement in vascular congestion and interstitial edema as well as bilateral upper lobe infiltrates. Continued followup is recommended. Electronically Signed   By: Inez Catalina M.D.   On: 09/10/2015 16:08    Scheduled Meds: . busPIRone  15 mg Oral TID  . dabigatran  150 mg Oral BID  . DULoxetine  60 mg Oral Daily  . folic acid  1 mg Oral Daily  . furosemide  40 mg Intravenous Daily  . gabapentin  600 mg Oral TID  . guaiFENesin  1,200 mg Oral BID  . ipratropium  0.5 mg Nebulization QID  . iron polysaccharides  150 mg Oral Daily  . levalbuterol  0.63 mg Nebulization QID  . lidocaine  1 patch Transdermal Q24H   . loratadine  10 mg Oral Daily  . magnesium oxide  400 mg Oral TID  . multivitamin with minerals  1 tablet Oral Daily  . pantoprazole  40 mg Oral Daily  . piperacillin-tazobactam (ZOSYN)  IV  3.375 g Intravenous Q8H  .  potassium chloride  40 mEq Oral Q6H  . thiamine  100 mg Oral Daily   Or  . thiamine  100 mg Intravenous Daily  . traZODone  50 mg Oral QHS   Continuous Infusions:    Principal Problem:   HCAP (healthcare-associated pneumonia) Active Problems:   Sepsis, unspecified organism ([place])   Alcohol dependence ([place])   Hypokalemia   Elevated troponin   Macrocytic anemia   Cirrhosis of liver ([place]): Per CT 09/07/2015   Depression   GAD (generalized anxiety disorder)   DVT (deep venous thrombosis) ([place]): hX OF MULTIPLE DVT PER PATIENT   Hyperlipidemia   GERD (gastroesophageal reflux disease)   Anemia, iron deficiency   Abnormal TSH   Volume overload    Time spent: 40 mins    [facility] A MD [facility] Pager [phone]. If 7PM-7AM, please contact night-coverage at [email or web address], password [email or web address] 09/12/2015, 12:19 PM  LOS: 5 days

## 2015-09-13 ENCOUNTER — Inpatient Hospital Stay (HOSPITAL_COMMUNITY): Payer: No Typology Code available for payment source

## 2015-09-13 LAB — RENAL FUNCTION PANEL
ANION GAP: 8 (ref 5–15)
Albumin: 2.9 g/dL — ABNORMAL LOW (ref 3.5–5.0)
BUN: 27 mg/dL — ABNORMAL HIGH (ref 6–20)
CHLORIDE: 94 mmol/L — AB (ref 101–111)
CO2: 32 mmol/L (ref 22–32)
Calcium: 9 mg/dL (ref 8.9–10.3)
Creatinine, Ser: 1.02 mg/dL — ABNORMAL HIGH (ref 0.44–1.00)
GFR calc Af Amer: >60 mL/min (ref >60)
GFR calc non Af Amer: 57 mL/min — ABNORMAL LOW (ref >60)
GLUCOSE: 143 mg/dL — AB (ref 65–99)
POTASSIUM: 4.1 mmol/L (ref 3.5–5.1)
Phosphorus: 3.4 mg/dL (ref 2.5–4.6)
Sodium: 134 mmol/L — ABNORMAL LOW (ref 135–145)

## 2015-09-13 LAB — MAGNESIUM: Magnesium: 2.2 mg/dL (ref 1.7–2.4)

## 2015-09-13 MED ORDER — ACETAMINOPHEN 325 MG PO TABS
650.0000 mg | ORAL_TABLET | Freq: Four times a day (QID) | ORAL | Status: DC | PRN
  Administered 2015-09-13 – 2015-09-14 (×2): 650 mg via ORAL
  Filled 2015-09-13 (×2): qty 2

## 2015-09-13 MED ORDER — LEVALBUTEROL HCL 0.63 MG/3ML IN NEBU
0.6300 mg | INHALATION_SOLUTION | Freq: Two times a day (BID) | RESPIRATORY_TRACT | Status: DC
  Administered 2015-09-13: 0.63 mg via RESPIRATORY_TRACT
  Filled 2015-09-13: qty 3

## 2015-09-13 MED ORDER — IPRATROPIUM BROMIDE 0.02 % IN SOLN
0.5000 mg | Freq: Two times a day (BID) | RESPIRATORY_TRACT | Status: DC
  Administered 2015-09-13: 0.5 mg via RESPIRATORY_TRACT
  Filled 2015-09-13: qty 2.5

## 2015-09-13 NOTE — Progress Notes (Signed)
[facility] PROGRESS NOTE  Abigail Santiago DXI:[id number] DOB: 26-Jul-1951 DOA: 09/07/2015 PCP: Tillie Fantasia, MD    HPI/Subjective: Improving overall, continue supportive care, continue same management. Increase ambulation and incentive spirometry.  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 Workup done in the ED including chest x-ray and CT scan of the chest was consistent with a multifocal pneumonia. Patient was admitted to the step down unit. Patient was placed empirically on IV vancomycin and IV Zosyn. Patient was also placed on Ativan withdrawal protocol. On admission patient was noted to be septic and placed on the sepsis protocol. Patient has been pancultured and results pending. Patient noted to have worsening shortness of breath with elevated blood pressure and clinical findings consistent with volume overload. Patient was placed on IV Lasix. Patient also on empiric IV antibiotics oxygen, nebulizer treatments, supportive care.  Assessment/Plan:  Multilobar healthcare associated pneumonia Patient had presented with fevers, shortness of breath, clinical findings consistent with a pneumonia Elevated lactic acid level, chest x-ray and CT findings concerning for multilobar pneumonia.  Blood cultures pending. Sputum Gram stain and culture pending.  Urine streptococcus antigen is negative. Urine Legionella antigen is negative.  On IV Zosyn, continue oxygen, Mucinex, nebulizers and antitussives. Increase ambulation, incentive spirometry. CXR improving.  Sepsis likely secondary to healthcare associated  pneumonia Patient met criteria for sepsis with tachycardia, tachypnea, borderline blood pressure in the presence of PNA. hypoxia when patient was first picked up by EMS with sats in the 60s to 70s. Lactic acid was also elevated. Hypoxia has improved. Lactic acid trended down.   Acute hypoxic respiratory failure Oxygen saturation was in the 60s to 70s on admission, patient requires 3-3.5 L of oxygen to keep sats above 90%. This is likely secondary to multi lobar pneumonia, CT scan showed no evidence of PE, she is on PRADAXA as well. Wean off of oxygen as she is improving.  Volume overload Patient had worsening of her SOB after admission which is likely secondary to iatrogenic fluid overload. Diffuse bilateral crackles on examination, started on Lasix 40 mg IV twice a day. Had a very good response with diuresis of 8.4 L and total negative of 6.9 L. Continue Lasix 40 mg daily, check x-ray in a.m.  Hypokalemia/hypomagnesemia Repleted, secondary to diuresis.  Elevated troponin Likely secondary to sepsis and current infection. Cardiac enzymes trending down.  2-D echo with EF of 55-60% with trivial aortic valvular regurgitation. No further workup needed at this time.   Iron deficiency anemia/microcytic anemia Patient with no overt bleeding. Anemia panel consistent with iron deficiency anemia with iron level of 8 and a ferritin of 153 with a folate of 31, B-12 = 304.Hemoglobin currently at 10,0 from 11.5 on admission. Partial dilutional effect. Patient has received IV iron dose. Will likely need to be on oral iron supplementation on discharge.  History of DVTs Pradaxa.  Gastroesophageal reflux disease PPI.  History of alcohol dependence Patient was at [name] for alcohol rehabilitation/detox prior to admission. Continue the Ativan withdrawal protocol. Continue thiamine, multivitamin, folic acid. Will likely need to go back to fellowship [facility] on discharge.  ?? Hyperlipidemia Fasting  lipid panel not indicative of  hyperlipidemia.Total cholesterol is 121. HDL is 84.  LDL is 30. No need for any medications at this time. Outpatient follow-up.  Depression/general anxiety disorder Stable. Continue Cymbalta and BuSpar.  Cirrhosis of the liver per CT scan Will likely need outpatient follow-up. [facility].  Abnormal TSH Free T4 within normal limits. T3 pending. Likely euthyroid sick syndrome. Patient will likely need repeat thyroid function studies done in about 4-6 weeks.     Code Status: Full Family Communication: Updated patient. No family at bedside. Disposition Plan: Remain in the step down unit.   Consultants:  None  Procedures:  Chest x-ray 09/07/2015, 09/09/2015  CT chest 09/07/2015  2-D echo 09/08/2015  Antibiotics:  IV vancomycin 09/07/2015>>>> 09/09/2015  IV Zosyn 09/07/2015   Objective: Filed Vitals:   09/13/15 0525  BP: 102/57  Pulse: 90  Temp: 98.6 F (37 C)  Resp: 20    Intake/Output Summary (Last 24 hours) at 09/13/15 1111 Last data filed at 09/12/15 1852  Gross per 24 hour  Intake    700 ml  Output    200 ml  Net    500 ml   Filed Weights   09/11/15 0500 09/12/15 0510 09/13/15 0525  Weight: 74.8 kg (164 lb 14.5 oz) 73.437 kg (161 lb 14.4 oz) 73.2 kg (161 lb 6 oz)    Exam:   General:  NAD.   Cardiovascular: Tachycardia  Respiratory: Diffuse coarse breath sounds with rhonchi. Diffuse crackles. Some use of accessory muscles of respiration.  Abdomen: Soft, nontender, nondistended, positive bowel sounds.  Musculoskeletal: No clubbing cyanosis or edema.  Data Reviewed: Basic Metabolic Panel:  Recent Labs Lab 09/07/15 1600 09/08/15 0346 09/09/15 0354 09/10/15 0325 09/12/15 0515 09/13/15 0500  NA  --  139 136 138 136 134*  K  --  4.6 4.7 3.8 3.4* 4.1  CL  --  107 105 98* 95* 94*  CO2  --  26 24 33* 32 32  GLUCOSE  --  193* 129* 124* 150* 143*  BUN  --  21* 18  24* 29* 27*  CREATININE  --  1.02* 0.87 1.11* 0.95 1.02*  CALCIUM  --  8.6* 8.7* 8.8* 9.1 9.0  MG 1.0* 2.1 1.7 1.9  --  2.2  PHOS  --   --   --   --  3.8 3.4   Liver Function Tests:  Recent Labs Lab 09/07/15 0850 09/12/15 0515 09/13/15 0500  AST 48*  --   --   ALT 35  --   --   ALKPHOS 86  --   --   BILITOT 0.6  --   --   PROT 6.5  --   --   ALBUMIN 3.4* 2.8* 2.9*    Recent Labs Lab 09/07/15 0900  LIPASE 57*   No results for input(s): AMMONIA in the last 168 hours. CBC:  Recent Labs Lab 09/07/15 0850 09/08/15 0346 09/09/15 0354 09/10/15 0325  WBC 6.4 9.2 10.0 8.6  NEUTROABS 5.0 7.4  --  6.0  HGB 11.5* 9.9* 10.0* 9.4*  HCT 34.1* 30.0* 30.8* 28.3*  MCV 111.8* 115.4* 115.4* 111.9*  PLT 162 144* 178 183   Cardiac Enzymes:  Recent Labs Lab 09/07/15 0908 09/07/15 1604 09/07/15 2126 09/08/15 0346  TROPONINI 0.24* 0.19* 0.09* 0.06*   BNP (last 3 results)  Recent Labs  09/07/15 0850  BNP 191.0*    ProBNP (last 3 results) No results for input(s): PROBNP in the last 8760 hours.  CBG: No results for input(s): GLUCAP in the last 168  hours.  Recent Results (from the past 240 hour(s))  Blood culture (routine x 2)     Status: None   Collection Time: 09/07/15 11:17 AM  Result Value Ref Range Status   Specimen Description BLOOD RIGHT HAND  Final   Special Requests BOTTLES DRAWN AEROBIC AND ANAEROBIC 5CC  Final   Culture   Final    NO GROWTH 5 DAYS Performed at [facility]    Report Status 09/12/2015 FINAL  Final  Blood culture (routine x 2)     Status: None   Collection Time: 09/07/15 11:26 AM  Result Value Ref Range Status   Specimen Description BLOOD LEFT HAND  Final   Special Requests BOTTLES DRAWN AEROBIC AND ANAEROBIC 5CC  Final   Culture   Final    NO GROWTH 5 DAYS Performed at [facility]    Report Status 09/12/2015 FINAL  Final  Urine culture     Status: None   Collection Time: 09/07/15  4:00 PM  Result Value Ref Range  Status   Specimen Description URINE, CLEAN CATCH  Final   Special Requests NONE  Final   Culture   Final    NO GROWTH 1 DAY Performed at [facility]    Report Status 09/08/2015 FINAL  Final  MRSA PCR Screening     Status: None   Collection Time: 09/07/15  4:03 PM  Result Value Ref Range Status   MRSA by PCR NEGATIVE NEGATIVE Final    Comment:        The GeneXpert MRSA Assay (FDA approved for NASAL specimens only), is one component of a comprehensive MRSA colonization surveillance program. It is not intended to diagnose MRSA infection nor to guide or monitor treatment for MRSA infections.      Studies: Dg Chest 2 View  09/13/2015  CLINICAL DATA:  Shortness of breath, pneumonia, pulmonary edema EXAM: CHEST  2 VIEW COMPARISON:  09/10/2015 FINDINGS: Multifocal patchy opacities in the bilateral upper lobes and left lower lobe, improved. No pleural effusion or pneumothorax. The heart is top-normal in size. IVC filter. Degenerative changes of the visualized thoracolumbar spine. IMPRESSION: Multifocal focal patchy opacities, right upper lobe predominant, improved. This overall appearance suggests improving multifocal pneumonia. Electronically Signed   By: Julian Hy M.D.   On: 09/13/2015 09:01    Scheduled Meds: . busPIRone  15 mg Oral TID  . dabigatran  150 mg Oral BID  . DULoxetine  60 mg Oral Daily  . folic acid  1 mg Oral Daily  . furosemide  40 mg Intravenous Daily  . gabapentin  600 mg Oral TID  . guaiFENesin  1,200 mg Oral BID  . ipratropium  0.5 mg Nebulization BID  . iron polysaccharides  150 mg Oral Daily  . levalbuterol  0.63 mg Nebulization BID  . lidocaine  1 patch Transdermal Q24H  . loratadine  10 mg Oral Daily  . magnesium oxide  400 mg Oral TID  . multivitamin with minerals  1 tablet Oral Daily  . pantoprazole  40 mg Oral Daily  . piperacillin-tazobactam (ZOSYN)  IV  3.375 g Intravenous Q8H  . thiamine  100 mg Oral Daily   Or  . thiamine  100  mg Intravenous Daily  . traZODone  50 mg Oral QHS   Continuous Infusions:    Principal Problem:   HCAP (healthcare-associated pneumonia) Active Problems:   Sepsis, unspecified organism ([place])   Alcohol dependence ([place])   Hypokalemia   Elevated troponin  Macrocytic anemia   Cirrhosis of liver ([place]): Per CT 09/07/2015   Depression   GAD (generalized anxiety disorder)   DVT (deep venous thrombosis) ([place]): hX OF MULTIPLE DVT PER PATIENT   Hyperlipidemia   GERD (gastroesophageal reflux disease)   Anemia, iron deficiency   Abnormal TSH   Volume overload    Time spent: 40 mins    [facility] A MD [facility] Pager [phone]. If 7PM-7AM, please contact night-coverage at [email or web address], password [email or web address] 09/13/2015, 11:11 AM  LOS: 6 days

## 2015-09-14 DIAGNOSIS — J189 Pneumonia, unspecified organism: Secondary | ICD-10-CM | POA: Insufficient documentation

## 2015-09-14 LAB — RENAL FUNCTION PANEL
ALBUMIN: 3 g/dL — AB (ref 3.5–5.0)
Anion gap: 8 (ref 5–15)
BUN: 28 mg/dL — ABNORMAL HIGH (ref 6–20)
CALCIUM: 9.5 mg/dL (ref 8.9–10.3)
CO2: 32 mmol/L (ref 22–32)
CREATININE: 1.09 mg/dL — AB (ref 0.44–1.00)
Chloride: 97 mmol/L — ABNORMAL LOW (ref 101–111)
GFR calc Af Amer: >60 mL/min (ref >60)
GFR, EST NON AFRICAN AMERICAN: 53 mL/min — AB (ref >60)
GLUCOSE: 135 mg/dL — AB (ref 65–99)
POTASSIUM: 3.9 mmol/L (ref 3.5–5.1)
Phosphorus: 4.5 mg/dL (ref 2.5–4.6)
SODIUM: 137 mmol/L (ref 135–145)

## 2015-09-14 NOTE — Plan of Care (Signed)
Problem: Activity: Goal: Risk for activity intolerance will decrease Outcome: Completed/Met Date Met:  09/14/15 Ambulated twice in the hall.

## 2015-09-14 NOTE — Progress Notes (Signed)
[facility] PROGRESS NOTE  Abigail Santiago ZOX:[id number] DOB: [birth year] DOA: 09/07/2015 PCP: Tillie Fantasia, MD    HPI/Subjective: Walk around yesterday without the need of oxygen, continue current management. Wean off of oxygen. Likely to be discharged in a.m.  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 Workup done in the ED including chest x-ray and CT scan of the chest was consistent with a multifocal pneumonia. Patient was admitted to the step down unit. Patient was placed empirically on IV vancomycin and IV Zosyn. Patient was also placed on Ativan withdrawal protocol. On admission patient was noted to be septic and placed on the sepsis protocol. Patient has been pancultured and results pending. Patient noted to have worsening shortness of breath with elevated blood pressure and clinical findings consistent with volume overload. Patient was placed on IV Lasix. Patient also on empiric IV antibiotics oxygen, nebulizer treatments, supportive care.  Assessment/Plan:  Multilobar healthcare associated pneumonia Patient had presented with fevers, shortness of breath, clinical findings consistent with a pneumonia Elevated lactic acid level, chest x-ray and CT findings concerning for multilobar pneumonia.  Blood cultures pending. Sputum Gram stain and culture pending.  Urine streptococcus antigen is negative. Urine Legionella antigen is negative.  On IV Zosyn, continue oxygen, Mucinex, nebulizers and antitussives. Increase ambulation, incentive spirometry. Continue current respiratory regimen.  Sepsis  likely secondary to healthcare associated pneumonia Patient met criteria for sepsis with tachycardia, tachypnea, borderline blood pressure in the presence of PNA. hypoxia when patient was first picked up by EMS with sats in the 60s to 70s. Lactic acid was also elevated. Hypoxia has improved. Lactic acid trended down.   Acute hypoxic respiratory failure Oxygen saturation was in the 60s to 70s on admission, patient requires 3-3.5 L of oxygen to keep sats above 90%. This is likely secondary to multi lobar pneumonia, CT scan showed no evidence of PE, she is on PRADAXA as well. Wean off of oxygen as she is improving.  Volume overload Patient had worsening of her SOB after admission which is likely secondary to iatrogenic fluid overload. Diffuse bilateral crackles on examination, started on Lasix 40 mg IV twice a day. Had a very good response with diuresis of 8.4 L and total negative of 6.9 L. Continue Lasix 40 mg daily, check x-ray in a.m.  Hypokalemia/hypomagnesemia Repleted, secondary to diuresis.  Elevated troponin Likely secondary to sepsis and current infection. Cardiac enzymes trending down.  2-D echo with EF of 55-60% with trivial aortic valvular regurgitation. No further workup needed at this time.   Iron deficiency anemia/microcytic anemia Patient with no overt bleeding. Anemia panel consistent with iron deficiency anemia with iron level of 8 and a ferritin of 153 with a folate of 31, B-12 = 304.Hemoglobin currently at 10,0 from 11.5 on admission. Partial dilutional effect. Patient has received IV iron dose. Will likely need to be on oral iron supplementation on discharge.  History of DVTs Pradaxa.  Gastroesophageal reflux disease PPI.  History of alcohol dependence Patient was at [name] for alcohol rehabilitation/detox prior to admission. Continue the Ativan withdrawal protocol. Continue thiamine, multivitamin, folic acid. Will likely need to go back to fellowship [facility]  on discharge.  ?? Hyperlipidemia Fasting lipid panel not  indicative of  hyperlipidemia.Total cholesterol is 121. HDL is 84. LDL is 30. No need for any medications at this time. Outpatient follow-up.  Depression/general anxiety disorder Stable. Continue Cymbalta and BuSpar.  Cirrhosis of the liver per CT scan Will likely need outpatient follow-up. [facility].  Abnormal TSH Free T4 within normal limits. T3 pending. Likely euthyroid sick syndrome. Patient will likely need repeat thyroid function studies done in about 4-6 weeks.     Code Status: Full Family Communication: Updated patient. No family at bedside. Disposition Plan: Remain in the step down unit.   Consultants:  None  Procedures:  Chest x-ray 09/07/2015, 09/09/2015  CT chest 09/07/2015  2-D echo 09/08/2015  Antibiotics:  IV vancomycin 09/07/2015>>>> 09/09/2015  IV Zosyn 09/07/2015   Objective: Filed Vitals:   09/14/15 0615  BP: 119/69  Pulse: 91  Temp: 98.3 F (36.8 C)  Resp: 18    Intake/Output Summary (Last 24 hours) at 09/14/15 1052 Last data filed at 09/14/15 0700  Gross per 24 hour  Intake     50 ml  Output   1050 ml  Net  -1000 ml   Filed Weights   09/12/15 0510 09/13/15 0525 09/14/15 0615  Weight: 73.437 kg (161 lb 14.4 oz) 73.2 kg (161 lb 6 oz) 73.1 kg (161 lb 2.5 oz)    Exam:   General:  NAD.   Cardiovascular: Tachycardia  Respiratory: Diffuse coarse breath sounds with rhonchi. Diffuse crackles. Some use of accessory muscles of respiration.  Abdomen: Soft, nontender, nondistended, positive bowel sounds.  Musculoskeletal: No clubbing cyanosis or edema.  Data Reviewed: Basic Metabolic Panel:  Recent Labs Lab 09/07/15 1600  09/08/15 0346 09/09/15 0354 09/10/15 0325 09/12/15 0515 09/13/15 0500 09/14/15 0500  NA  --   < > 139 136 138 136 134* 137  K  --   < > 4.6 4.7 3.8 3.4* 4.1 3.9  CL  --   < > 107 105 98* 95* 94* 97*   CO2  --   < > 26 24 33* 32 32 32  GLUCOSE  --   < > 193* 129* 124* 150* 143* 135*  BUN  --   < > 21* 18 24* 29* 27* 28*  CREATININE  --   < > 1.02* 0.87 1.11* 0.95 1.02* 1.09*  CALCIUM  --   < > 8.6* 8.7* 8.8* 9.1 9.0 9.5  MG 1.0*  --  2.1 1.7 1.9  --  2.2  --   PHOS  --   --   --   --   --  3.8 3.4 4.5  < > = values in this interval not displayed. Liver Function Tests:  Recent Labs Lab 09/12/15 0515 09/13/15 0500 09/14/15 0500  ALBUMIN 2.8* 2.9* 3.0*   No results for input(s): LIPASE, AMYLASE in the last 168 hours. No results for input(s): AMMONIA in the last 168 hours. CBC:  Recent Labs Lab 09/08/15 0346 09/09/15 0354 09/10/15 0325  WBC 9.2 10.0 8.6  NEUTROABS 7.4  --  6.0  HGB 9.9* 10.0* 9.4*  HCT 30.0* 30.8* 28.3*  MCV 115.4* 115.4* 111.9*  PLT 144* 178 183   Cardiac Enzymes:  Recent Labs Lab 09/07/15 1604 09/07/15 2126 09/08/15 0346  TROPONINI 0.19* 0.09* 0.06*   BNP (last 3 results)  Recent Labs  09/07/15 0850  BNP 191.0*    ProBNP (last 3 results) No results for input(s): PROBNP in the last 8760 hours.  CBG: No results for input(s):  GLUCAP in the last 168 hours.  Recent Results (from the past 240 hour(s))  Blood culture (routine x 2)     Status: None   Collection Time: 09/07/15 11:17 AM  Result Value Ref Range Status   Specimen Description BLOOD RIGHT HAND  Final   Special Requests BOTTLES DRAWN AEROBIC AND ANAEROBIC 5CC  Final   Culture   Final    NO GROWTH 5 DAYS Performed at [facility]    Report Status 09/12/2015 FINAL  Final  Blood culture (routine x 2)     Status: None   Collection Time: 09/07/15 11:26 AM  Result Value Ref Range Status   Specimen Description BLOOD LEFT HAND  Final   Special Requests BOTTLES DRAWN AEROBIC AND ANAEROBIC 5CC  Final   Culture   Final    NO GROWTH 5 DAYS Performed at [facility]    Report Status 09/12/2015 FINAL  Final  Urine culture     Status: None   Collection Time: 09/07/15   4:00 PM  Result Value Ref Range Status   Specimen Description URINE, CLEAN CATCH  Final   Special Requests NONE  Final   Culture   Final    NO GROWTH 1 DAY Performed at [facility]    Report Status 09/08/2015 FINAL  Final  MRSA PCR Screening     Status: None   Collection Time: 09/07/15  4:03 PM  Result Value Ref Range Status   MRSA by PCR NEGATIVE NEGATIVE Final    Comment:        The GeneXpert MRSA Assay (FDA approved for NASAL specimens only), is one component of a comprehensive MRSA colonization surveillance program. It is not intended to diagnose MRSA infection nor to guide or monitor treatment for MRSA infections.      Studies: Dg Chest 2 View  09/13/2015  CLINICAL DATA:  Shortness of breath, pneumonia, pulmonary edema EXAM: CHEST  2 VIEW COMPARISON:  09/10/2015 FINDINGS: Multifocal patchy opacities in the bilateral upper lobes and left lower lobe, improved. No pleural effusion or pneumothorax. The heart is top-normal in size. IVC filter. Degenerative changes of the visualized thoracolumbar spine. IMPRESSION: Multifocal focal patchy opacities, right upper lobe predominant, improved. This overall appearance suggests improving multifocal pneumonia. Electronically Signed   By: Julian Hy M.D.   On: 09/13/2015 09:01    Scheduled Meds: . busPIRone  15 mg Oral TID  . dabigatran  150 mg Oral BID  . DULoxetine  60 mg Oral Daily  . folic acid  1 mg Oral Daily  . gabapentin  600 mg Oral TID  . guaiFENesin  1,200 mg Oral BID  . iron polysaccharides  150 mg Oral Daily  . lidocaine  1 patch Transdermal Q24H  . loratadine  10 mg Oral Daily  . magnesium oxide  400 mg Oral TID  . multivitamin with minerals  1 tablet Oral Daily  . pantoprazole  40 mg Oral Daily  . piperacillin-tazobactam (ZOSYN)  IV  3.375 g Intravenous Q8H  . thiamine  100 mg Oral Daily   Or  . thiamine  100 mg Intravenous Daily  . traZODone  50 mg Oral QHS   Continuous Infusions:     Principal Problem:   HCAP (healthcare-associated pneumonia) Active Problems:   Sepsis, unspecified organism ([place])   Alcohol dependence ([place])   Hypokalemia   Elevated troponin   Macrocytic anemia   Cirrhosis of liver ([place]): Per CT 09/07/2015   Depression   GAD (generalized  anxiety disorder)   DVT (deep venous thrombosis) ([place]): hX OF MULTIPLE DVT PER PATIENT   Hyperlipidemia   GERD (gastroesophageal reflux disease)   Anemia, iron deficiency   Abnormal TSH   Volume overload    Time spent: 40 mins    [facility] A MD [facility] Pager [phone]. If 7PM-7AM, please contact night-coverage at [email or web address], password [facility] 09/14/2015, 10:52 AM  LOS: 7 days

## 2015-09-14 NOTE — Progress Notes (Addendum)
Ambulated with patient in the hall.  02 Sats stayed above 94%  Patient tolerated well.

## 2015-09-14 NOTE — Progress Notes (Addendum)
ANTIBIOTIC CONSULT NOTE   Pharmacy Consult for Zosyn Indication:  PNA  No Known Allergies  Patient Measurements: Height:  (160 cm) Weight: 161 lb 2.5 oz (73.1 kg) IBW/kg (Calculated) : 52.4  Vital Signs: Temp: 98.3 F (36.8 C) (11/13 0615) Temp Source: Oral (11/13 0615) BP: 119/69 mmHg (11/13 0615) Pulse Rate: 91 (11/13 0615) Intake/Output from previous day: 11/12 0701 - 11/13 0700 In: 50 [IV Piggyback:50] Out: 1050 [Urine:1050] Intake/Output from this shift:    Labs:  Recent Labs  09/12/15 0515 09/13/15 0500 09/14/15 0500  CREATININE 0.95 1.02* 1.09*   Estimated Creatinine Clearance: 50.6 mL/min (by C-G formula based on Cr of 1.09). No results for input(s): VANCOTROUGH, VANCOPEAK, VANCORANDOM, GENTTROUGH, GENTPEAK, GENTRANDOM, TOBRATROUGH, TOBRAPEAK, TOBRARND, AMIKACINPEAK, AMIKACINTROU, AMIKACIN in the last 72 hours.   Microbiology: Recent Results (from the past 720 hour(s))  Blood culture (routine x 2)     Status: None   Collection Time: 09/07/15 11:17 AM  Result Value Ref Range Status   Specimen Description BLOOD RIGHT HAND  Final   Special Requests BOTTLES DRAWN AEROBIC AND ANAEROBIC 5CC  Final   Culture   Final    NO GROWTH 5 DAYS Performed at [facility]    Report Status 09/12/2015 FINAL  Final  Blood culture (routine x 2)     Status: None   Collection Time: 09/07/15 11:26 AM  Result Value Ref Range Status   Specimen Description BLOOD LEFT HAND  Final   Special Requests BOTTLES DRAWN AEROBIC AND ANAEROBIC 5CC  Final   Culture   Final    NO GROWTH 5 DAYS Performed at [facility]    Report Status 09/12/2015 FINAL  Final  Urine culture     Status: None   Collection Time: 09/07/15  4:00 PM  Result Value Ref Range Status   Specimen Description URINE, CLEAN CATCH  Final   Special Requests NONE  Final   Culture   Final    NO GROWTH 1 DAY Performed at [facility]    Report Status 09/08/2015 FINAL  Final  MRSA PCR  Screening     Status: None   Collection Time: 09/07/15  4:03 PM  Result Value Ref Range Status   MRSA by PCR NEGATIVE NEGATIVE Final    Comment:        The GeneXpert MRSA Assay (FDA approved for NASAL specimens only), is one component of a comprehensive MRSA colonization surveillance program. It is not intended to diagnose MRSA infection nor to guide or monitor treatment for MRSA infections.     Medical History: Past Medical History  Diagnosis Date  . Hypertension   . Pancreatitis     x3  . Depression   . Alcohol dependence (HCC) 09/07/2015  . Macrocytic anemia 09/07/2015  . Cirrhosis of liver [facility]): Per CT 09/07/2015 09/07/2015  . GAD (generalized anxiety disorder) 09/07/2015  . DVT (deep venous thrombosis) (HCC): hX OF MULTIPLE DVT PER PATIENT 09/07/2015  . Hyperlipidemia 09/07/2015  . GERD (gastroesophageal reflux disease) 09/07/2015  . Anemia, iron deficiency 09/08/2015   Assessment: Patient is a 64 y.o F [facility] with c/o SOB.  She was found to have elevated LA and Chest CT showed severe multilobe PNA.  Staredt broad abx with vancomycin and zosyn for r/o sepsis/PNA.  - 11/6 chest CT: severe multilobe PNA, cirrhosis - 11/9 CXR: BUL infiltrates improved - 11/12 CXR - continued improvement  11/6 vanc >> 11/8 11/6  zosyn >> 11/6 Tamiflu >> 11/7  11/6 urine: ng-final 11/6 blood x2: NGF 11/6 leg/strep ur: both neg 11/6 influenza: neg  Today, 09/14/2015: Temp: afebrile WBC: wnl (11/9) Renal: SCr stable wnl; CrCl 50 CG   Goal of Therapy:  Eradication of infection Appropriate antibiotic dosing for indication and renal function  Plan:  Day 8 antibiotics  Continue Zosyn 3.375 g IV given every 8 hrs by 4-hr infusion  Follow for de-escalation of antibiotics and LOT - Standard duration of therapy for HCAP is 8 days; consider stopping Zosyn tomorrow   Bernadene Personrew Jalaysha Skilton, PharmD, BCPS Pager: [phone] 09/14/2015, 1:46  PM

## 2015-09-15 LAB — RENAL FUNCTION PANEL
Albumin: 2.9 g/dL — ABNORMAL LOW (ref 3.5–5.0)
Anion gap: 8 (ref 5–15)
BUN: 28 mg/dL — AB (ref 6–20)
CHLORIDE: 97 mmol/L — AB (ref 101–111)
CO2: 29 mmol/L (ref 22–32)
Calcium: 9.2 mg/dL (ref 8.9–10.3)
Creatinine, Ser: 0.96 mg/dL (ref 0.44–1.00)
GFR calc Af Amer: >60 mL/min (ref >60)
GFR calc non Af Amer: >60 mL/min (ref >60)
GLUCOSE: 123 mg/dL — AB (ref 65–99)
POTASSIUM: 3.6 mmol/L (ref 3.5–5.1)
Phosphorus: 4.1 mg/dL (ref 2.5–4.6)
Sodium: 134 mmol/L — ABNORMAL LOW (ref 135–145)

## 2015-09-15 MED ORDER — GUAIFENESIN ER 600 MG PO TB12: 1200.0000 mg | ORAL_TABLET | Freq: Two times a day (BID) | ORAL | Status: AC

## 2015-09-15 MED ORDER — AMOXICILLIN-POT CLAVULANATE 875-125 MG PO TABS
1.0000 | ORAL_TABLET | Freq: Two times a day (BID) | ORAL | Status: DC
  Administered 2015-09-15 – 2015-09-16 (×3): 1 via ORAL
  Filled 2015-09-15 (×3): qty 1

## 2015-09-15 MED ORDER — AMOXICILLIN-POT CLAVULANATE 875-125 MG PO TABS: 1.0000 | ORAL_TABLET | Freq: Two times a day (BID) | ORAL | Status: AC

## 2015-09-15 NOTE — Progress Notes (Signed)
Patient showered and is walking in hall independently.  [facility].  Patient states she feels much better and is getting stronger.

## 2015-09-15 NOTE — Progress Notes (Signed)
Walked with patient in the hall without her walker.  Patient tolerated fair.  O2 sats stayed above 94%.  Will walk with patient again later today.

## 2015-09-15 NOTE — Discharge Summary (Signed)
Physician Discharge Summary  Abigail Santiago MRN:[id number] DOB: 12/05/1950 DOA: 09/07/2015  PCP: LEHR,JANET, MD  Admit date: 09/07/2015 Discharge date: 09/15/2015  Time spent: 45 minutes  Recommendations for Outpatient Follow-up:  1. Follow-up with primary care physician within one week. 2. Augmentin for 5 more days.   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 Patient stated she felt so bad that she fell asleep and to lectures. Patient denied any chest pain, no diarrhea, no constipation, no dysuria, no hematemesis, no hematochezia, no melena. EMS was called and per ED notes upon arrival patient's O2 sats  was 60-70% and patient was placed on 3 L nasal cannula with an increase in the sats to 92%. Patient was noted to be wheezing at the time and given an albuterol nebulizer and dual nebs as well as Solu-Medrol 125 mg IV push. Patient was seen in the ED, patient was noted to be tachycardic with heart rates as high as 129 with a temp of 100.9 respiratory rate of up to 31 and initial systolic blood pressure 107/69. ABG obtained had a pH of 7.34 PCO2 of 44 PO2 of 75 otherwise was within normal limits. Lactic acid level was elevated at 2.94. INR was 1.22., Troponin was 0.24. CBC had a hemoglobin of 11.5 with a MCV of 111.8 otherwise was within normal limits. Comprehensive metabolic profile at a potassium of 3.2 chloride of 100 BUN of 25 creatinine of 1.02 glucose of 243 albumin of 3.4 AST of 48 otherwise was within normal limits. Chest x-ray does show bilateral upper lobe airspace opacities with possible cavitary process involving the left upper lobe. CT of the chest which was done showed severe multilobar pneumonia, mild cardiomegaly, morphological changes in the liver compatible with cirrhosis. Patient was given IV vancomycin and IV Zosyn as she did recently been hospitalized at [facility] within the past 3 months. [facility] will call to admit the patient for further evaluation and management.  Hospital Course:   Multilobar healthcare associated pneumonia Patient had presented with fevers, shortness of breath, clinical findings consistent with a pneumonia Elevated lactic acid level, chest x-ray and CT findings concerning for multilobar pneumonia.  Blood sputum culture did not show specific bacteria. Urine streptococcus antigen is negative. Urine Legionella antigen is negative.    Treated with IV Zosyn, continue oxygen, Mucinex, nebulizers and antitussives. Discharged on Augmentin for 5 more days, Mucinex as well.  Sepsis likely secondary to healthcare associated pneumonia Patient met criteria for  sepsis with tachycardia, tachypnea, borderline blood pressure in the presence of PNA. hypoxia when patient was first picked up by EMS with sats in the 60s to 70s. Lactic acid was also elevated. Hypoxia has improved. Lactic acid trended down.   Acute hypoxic respiratory failure Oxygen saturation was in the 60s to 70s on admission, patient requires 3-3.5 L of oxygen to keep sats above 90%. This is likely secondary to multi lobar pneumonia, CT scan showed no evidence of PE, she is on PRADAXA as well. Currently on room air, ambulating without desaturation episodes.  Volume overload Patient had worsening of her SOB after admission which is likely secondary to iatrogenic fluid overload. Diffuse bilateral crackles on examination, started on Lasix 40 mg IV twice a day. Had a very good response with diuresis of 8.4 L and total negative of 6.9 L. Please note that patient has history of alcohol abuse, her CT scan questioned cirrhosis, if patient develops any type of fluid overload please consider diuretics.  Hypokalemia/hypomagnesemia Repleted, secondary to diuresis.  Elevated troponin Likely secondary to sepsis and current infection. Cardiac enzymes trending down.  2-D echo with EF of 55-60% with trivial aortic valvular regurgitation. No further workup needed at this time.   Iron deficiency anemia/microcytic anemia Patient with no overt bleeding. Anemia panel consistent with iron deficiency anemia with iron level of 8 and a ferritin of 153 with a folate of 31, B-12 = 304.Hemoglobin currently at 10,0 from 11.5 on admission. Partial dilutional effect. Patient has received IV iron dose. Will likely need to be on oral iron supplementation on discharge.  History of DVTs Pradaxa.  Gastroesophageal reflux disease PPI.  History of alcohol dependence [facility]. Continue the Ativan withdrawal protocol. Continue thiamine,  multivitamin, folic acid. Will likely need to [facility] to [facility].  ?? Hyperlipidemia Fasting lipid panel not indicative of hyperlipidemia.Total cholesterol is 121. HDL is 84. LDL is 30. No need for any medications at this time. Outpatient follow-up.  Depression/general anxiety disorder Stable. Continue Cymbalta and BuSpar.  Cirrhosis of the liver per CT scan Will likely need outpatient follow-up. [facility]  Abnormal TSH Free T4 within normal limits. T3 pending. Likely euthyroid sick syndrome. Patient will likely need repeat thyroid function studies done in about 4-6 weeks.    Procedures:  2-D echo done on 09/08/2015. Study Conclusions  - Left ventricle: The cavity size was normal. Wall thickness was normal. Systolic function was normal. The estimated ejection fraction was in the range of 55% to 60%. - Aortic valve: There was trivial regurgitation.  Consultations:  None  Discharge Exam: Filed Vitals:   09/15/15 0548  BP: 133/61  Pulse: 93  Temp: 97.5 F (36.4 C)  Resp: 20   General: Alert and awake, oriented x3, not in any acute distress. HEENT: anicteric sclera, pupils reactive to light and accommodation, EOMI CVS: S1-S2 clear, no murmur rubs or gallops Chest: clear to auscultation bilaterally, no wheezing, rales or rhonchi Abdomen: soft nontender, nondistended, normal bowel sounds, no organomegaly Extremities: no cyanosis, clubbing or edema noted bilaterally Neuro: Cranial nerves II-XII intact, no focal neurological deficits  Discharge Instructions   Discharge Instructions    Diet - low sodium heart healthy      Complete by:  As directed      Increase activity slowly    Complete by:  As directed           Current Discharge Medication List    START taking these medications   Details  amoxicillin-clavulanate (AUGMENTIN) 875-125 MG tablet Take 1 tablet by mouth 2 (two) times  daily. Qty: 10 tablet, Refills: 0      CONTINUE these medications which have CHANGED   Details  guaiFENesin (MUCINEX) 600 MG 12 hr tablet Take 2 tablets (1,200 mg total) by mouth 2 (two) times daily. Qty: 30 tablet, Refills: 0      CONTINUE these medications which have NOT CHANGED   Details  alum & mag hydroxide-simeth (MAALOX/MYLANTA) 200-200-20 MG/5ML suspension Take 30 mLs by mouth every 6 (six) hours as needed for indigestion or heartburn.    anti-nausea (EMETROL) solution Take 5-10 mLs by mouth every 15 (fifteen) minutes as needed for nausea or vomiting.    busPIRone (BUSPAR) 15 MG tablet Take 15 mg by mouth 3 (three) times daily.    Calcium Citrate-Vitamin D (CALCIUM + D PO) Take 1 tablet by mouth daily.    cetirizine (ZYRTEC) 10 MG tablet Take 10 mg by mouth daily as needed for allergies.    Cholecalciferol (VITAMIN D PO) Take 1 capsule by mouth daily.    dabigatran (PRADAXA) 150 MG CAPS capsule Take 150 mg by mouth 2 (two) times daily.    dicyclomine (BENTYL) 20 MG tablet Take 20 mg by mouth 2 (two) times daily as needed for spasms.    DULoxetine (CYMBALTA) 60 MG capsule Take 60 mg by mouth daily.    gabapentin (NEURONTIN) 600 MG tablet Take 600 mg by mouth 3 (three) times daily.    lidocaine (LIDODERM) 5 % Place 1 patch onto the skin daily. Remove & Discard patch within 12 hours or as directed by MD    lisinopril (PRINIVIL,ZESTRIL) 20 MG tablet Take 20 mg by mouth daily.    loperamide (IMODIUM A-D) 2 MG tablet Take 2 mg by mouth 4 (four) times daily as needed for diarrhea or loose stools.    magnesium oxide (MAG-OX) 400 MG tablet Take 400 mg by mouth 3 (three) times daily.    Multiple Vitamin (MULTIVITAMIN WITH MINERALS) TABS tablet Take 1 tablet by mouth daily.    ondansetron (ZOFRAN) 8 MG tablet Take 8 mg by mouth 2 (two) times daily as needed for nausea or vomiting.    pantoprazole (PROTONIX) 40 MG tablet Take 40 mg by mouth daily.    thiamine 100 MG tablet  Take 100 mg by mouth daily.    traZODone (DESYREL) 50 MG tablet Take 50 mg by mouth at bedtime.      STOP taking these medications     benzocaine-menthol (CHLORASEPTIC) 6-10 MG lozenge      benzonatate (TESSALON) 100 MG capsule      chlordiazePOXIDE (LIBRIUM) 5 MG capsule      diazepam (VALIUM) 5 MG/ML injection      ibuprofen (ADVIL,MOTRIN) 600 MG tablet      methocarbamol (ROBAXIN) 500 MG tablet        No Known Allergies Follow-up Information    Follow up with LEHR,JANET, MD In 1 week.   Specialty:  Family Medicine       The results of significant diagnostics from this hospitalization (including imaging, microbiology, ancillary and laboratory) are listed below for reference.    Significant Diagnostic Studies: Dg Chest 2 View  09/13/2015  CLINICAL   DATA:  Shortness of breath, pneumonia, pulmonary edema EXAM: CHEST  2 VIEW COMPARISON:  09/10/2015 FINDINGS: Multifocal patchy opacities in the bilateral upper lobes and left lower lobe, improved. No pleural effusion or pneumothorax. The heart is top-normal in size. IVC filter. Degenerative changes of the visualized thoracolumbar spine. IMPRESSION: Multifocal focal patchy opacities, right upper lobe predominant, improved. This overall appearance suggests improving multifocal pneumonia. Electronically Signed   By: Julian Hy M.D.   On: 09/13/2015 09:01   Dg Chest 2 View  09/07/2015  CLINICAL DATA:  Weakness, dizziness, shortness of breath and cough EXAM: CHEST  2 VIEW COMPARISON:  None. FINDINGS: Heart size is normal. No pleural effusion identified. Asymmetric elevation of right hemidiaphragm identified. There are bilateral, upper lobe predominant pulmonary opacities left greater than right. There may be a cavitary process involving the left upper lobe. The visualized osseous structures appear intact. IMPRESSION: 1. Bilateral, upper lobe predominant airspace opacities with possible cavitary process involving the left upper lobe.  Recommend further evaluation with CT of the chest. Electronically Signed   By: Kerby Moors M.D.   On: 09/07/2015 10:05   Ct Chest W Contrast  09/07/2015  CLINICAL DATA:  64 year old female with a history of drug abuse. Difficulty breathing this morning. Hypoxemia. EXAM: CT CHEST WITH CONTRAST TECHNIQUE: Multidetector CT imaging of the chest was performed during intravenous contrast administration. CONTRAST:  51m OMNIPAQUE IOHEXOL 300 MG/ML  SOLN COMPARISON:  No priors. FINDINGS: Mediastinum/Lymph Nodes: Heart size is mildly enlarged. There is no significant pericardial fluid, thickening or pericardial calcification. No pathologically enlarged mediastinal or hilar lymph nodes. Small hiatal hernia. No axillary lymphadenopathy. Lungs/Pleura: Widespread peribronchovascular ground-glass attenuation and thickening of the peribronchovascular interstitium with extensive architectural distortion throughout the lungs bilaterally. No craniocaudal gradient. No pleural effusions. Study is limited by extensive respiratory motion. Upper Abdomen: Liver has a shrunken appearance and nodular contour, compatible with underlying cirrhosis. Musculoskeletal/Soft Tissues: There are no aggressive appearing lytic or blastic lesions noted in the visualized portions of the skeleton. Compression fracture of superior endplate of TX21with approximately 15% loss of anterior vertebral body height, likely chronic. IMPRESSION: 1. Severe multilobar pneumonia, as above. 2. Mild cardiomegaly. 3. Morphologic changes in the liver compatible with cirrhosis. 4. Small hiatal hernia. Electronically Signed   By: DVinnie LangtonM.D.   On: 09/07/2015 12:33   Mr Brain Wo Contrast  09/04/2015  CLINICAL DATA:  Dizziness. Imbalance. Alcohol abuse with recent withdrawal. EXAM: MRI HEAD WITHOUT CONTRAST TECHNIQUE: Multiplanar, multiecho pulse sequences of the brain and surrounding structures were obtained without intravenous contrast. COMPARISON:  None.  FINDINGS: Mild atrophy and periventricular T2 changes bilaterally are within normal limits for age. No acute infarct, hemorrhage, or mass lesion is present. The ventricles are of normal size. No significant extraaxial fluid collection is present. Mild white matter changes extend into the brainstem. The cerebellum is unremarkable. Flow is present in the major intracranial arteries. Globes orbits are intact. The paranasal sinuses and mastoid air cells are clear. The skullbase is within normal limits. Midline structures are unremarkable. IMPRESSION: 1. Mild atrophy and white matter changes are within normal limits for age. 2. No acute or focal intracranial abnormality to explain the patient's symptoms. Electronically Signed   By: CSan MorelleM.D.   On: 09/04/2015 20:18   Dg Chest Port 1 View  09/10/2015  CLINICAL DATA:  Shortness of breath and hypoxia EXAM: PORTABLE CHEST - 1 VIEW COMPARISON:  09/09/2015 FINDINGS: Cardiac shadow is within normal limits. Vascular congestion is  again identified although the degree of congestion and edema it is improved when compare with the prior exam. Persistent upper lobe infiltrates are seen but mildly improved when compare with the prior study. No new focal infiltrate is seen. IMPRESSION: Overall improvement in vascular congestion and interstitial edema as well as bilateral upper lobe infiltrates. Continued followup is recommended. Electronically Signed   By: Mark  Lukens M.D.   On: 09/10/2015 16:08   Dg Chest Port 1 View  09/09/2015  CLINICAL DATA:  Shortness of breath . EXAM: PORTABLE CHEST 1 VIEW COMPARISON:  CT 09/07/2015.  Chest x-ray 09/07/2015. FINDINGS: Cardiomegaly diffuse bilateral from interstitial prominence particular the upper lobes consistent with congestive heart failure. Bilateral upper lobe pneumonia, right side greater than left cannot be excluded. Cavitary infiltrate left upper lobe again cannot be excluded. No prominent pleural effusion or  pneumothorax. IMPRESSION: 1. Cardiomegaly with interim progression of diffuse bilateral pulmonary interstitial prominence consistent with congestive heart failure. 2. Bilateral upper lobe pulmonary infiltrates, right side greater than left. Pneumonia cannot be excluded. Cavitary infiltrate in the left upper lobe again cannot be excluded. Electronically Signed   By: Thomas  Register   On: 09/09/2015 09:14    Microbiology: Recent Results (from the past 240 hour(s))  Blood culture (routine x 2)     Status: None   Collection Time: 09/07/15 11:17 AM  Result Value Ref Range Status   Specimen Description BLOOD RIGHT HAND  Final   Special Requests BOTTLES DRAWN AEROBIC AND ANAEROBIC 5CC  Final   Culture   Final    NO GROWTH 5 DAYS Performed at [facility]    Report Status 09/12/2015 FINAL  Final  Blood culture (routine x 2)     Status: None   Collection Time: 09/07/15 11:26 AM  Result Value Ref Range Status   Specimen Description BLOOD LEFT HAND  Final   Special Requests BOTTLES DRAWN AEROBIC AND ANAEROBIC 5CC  Final   Culture   Final    NO GROWTH 5 DAYS Performed at [facility]    Report Status 09/12/2015 FINAL  Final  Urine culture     Status: None   Collection Time: 09/07/15  4:00 PM  Result Value Ref Range Status   Specimen Description URINE, CLEAN CATCH  Final   Special Requests NONE  Final   Culture   Final    NO GROWTH 1 DAY Performed at [facility]    Report Status 09/08/2015 FINAL  Final  MRSA PCR Screening     Status: None   Collection Time: 09/07/15  4:03 PM  Result Value Ref Range Status   MRSA by PCR NEGATIVE NEGATIVE Final    Comment:        The GeneXpert MRSA Assay (FDA approved for NASAL specimens only), is one component of a comprehensive MRSA colonization surveillance program. It is not intended to diagnose MRSA infection nor to guide or monitor treatment for MRSA infections.      Labs: Basic Metabolic Panel:  Recent Labs Lab  09/09/15 0354 09/10/15 0325 09/12/15 0515 09/13/15 0500 09/14/15 0500 09/15/15 0441  NA 136 138 136 134* 137 134*  K 4.7 3.8 3.4* 4.1 3.9 3.6  CL 105 98* 95* 94* 97* 97*  CO2 24 33* 32 32 32 29  GLUCOSE 129* 124* 150* 143* 135* 123*  BUN 18 24* 29* 27* 28* 28*  CREATININE 0.87 1.11* 0.95 1.02* 1.09* 0.96  CALCIUM 8.7* 8.8* 9.1 9.0 9.5 9.2  MG 1.7 1.9  --    2.2  --   --   PHOS  --   --  3.8 3.4 4.5 4.1   Liver Function Tests:  Recent Labs Lab 09/12/15 0515 09/13/15 0500 09/14/15 0500 09/15/15 0441  ALBUMIN 2.8* 2.9* 3.0* 2.9*   No results for input(s): LIPASE, AMYLASE in the last 168 hours. No results for input(s): AMMONIA in the last 168 hours. CBC:  Recent Labs Lab 09/09/15 0354 09/10/15 0325  WBC 10.0 8.6  NEUTROABS  --  6.0  HGB 10.0* 9.4*  HCT 30.8* 28.3*  MCV 115.4* 111.9*  PLT 178 183   Cardiac Enzymes: No results for input(s): CKTOTAL, CKMB, CKMBINDEX, TROPONINI in the last 168 hours. BNP: BNP (last 3 results)  Recent Labs  09/07/15 0850  BNP 191.0*    ProBNP (last 3 results) No results for input(s): PROBNP in the last 8760 hours.  CBG: No results for input(s): GLUCAP in the last 168 hours.     Signed:  Melville Engen A  [facility] 09/15/2015, 10:55 AM

## 2015-09-15 NOTE — Progress Notes (Signed)
This writer is taking over care of the patient and agrees with the previous RNs assessment. Will continue to monitor.  Lakendria Nicastro, RN 

## 2015-09-15 NOTE — Progress Notes (Signed)
Patient independently ambulated 5 laps (without a walker) over the last few hours. Patient states she is feeling stronger, does not complain of SOB.

## 2015-09-15 NOTE — Progress Notes (Signed)
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  Follow Up Recommendations  No OT follow up          Precautions / Restrictions Precautions Precautions: Fall Precaution Comments: monitor sats Restrictions Weight Bearing Restrictions: No       Mobility Bed Mobility               General bed mobility comments: pt OOB  Transfers Overall transfer level: Needs assistance   Transfers: Sit to/from Stand Sit to Stand: Supervision Stand pivot transfers: Supervision                ADL       Grooming: Standing   Upper Body Bathing: Independent   Lower Body Bathing: Supervison/ safety;Sit to/from stand   Upper Body Dressing : Independent;Sitting   Lower Body Dressing: Supervision/safety;Sit to/from stand   Toilet Transfer: Retail bankerupervision/safety;Regular Toilet;Ambulation   Toileting- Clothing Manipulation and Hygiene: Supervision/safety;Sit to/from stand   Tub/ Engineer, structuralhower Transfer: Supervision/safety   Functional mobility during ADLs: Supervision/safety                  Cognition   Behavior During Therapy: WFL for tasks assessed/performed Overall Cognitive Status: Within Functional Limits for tasks assessed                               General Comments      Pertinent Vitals/ Pain       Pain Assessment: No/denies pain            Progress Toward Goals  OT Goals(current goals can now be found in the care plan section)  Progress towards OT goals: Progressing toward goals     Plan          End of Session     Activity Tolerance Patient tolerated treatment well   Patient Left in chair   Nurse Communication Mobility status        Time: [id number] OT Time Calculation (min): 36 min  Charges: OT General Charges $OT Visit: 1 Procedure OT Treatments $Self Care/Home Management : 23-37 mins  Rhianne Soman, Metro KungLorraine D 09/15/2015, 2:57 PM

## 2015-09-15 NOTE — Progress Notes (Addendum)
CSW continuing to follow.   [facility] and was hopeful to be able to return.  [facility] and facility reviewed information. Per [facility], [facility] not feel that [facility] is strong enough to [facility] at this [facility].   CSW spoke with pt. Pt was very [facility] to [facility] as [facility] was difficult [facility]. CSW discussed with pt that [facility] does not feel that [facility] can return at this time. Pt was hopeful to remain in the hospital until [facility] and CSW explained that pt insurance will not allow her to remain in the hospital. Pt requested CSW contact pt sister as pt would like to stay with pt sister upon discharge in order to continue her recovery.   CSW contacted pt sister, Renaldo ReelKaren Hess via telephone [phone]). CSW updated pt sister regarding that [facility] does not feel that pt strong enough to return to program at this time. CSW explained that insurance will not cover continued hospitalization until [facility] is willing to [facility]. Pt sister confirmed that pt family can make a plan for pt, but pt sister lives four hours away and confirmed that she can pick pt up from the hospital tomorrow 09/16/15. Pt sister wants pt to stay with her upon discharge in order to continue support to pt surrounding her sobriety. [place] and does not have a safe discharge plan today, but pt sister confirms that she can pick pt up tomorrow and pt can stay with her at her home 4 hours away.   CSW updated RNCM and MD.  Pt is NOT safe to discharge today as pt does not have safe discharge in place for today. Plan for discharge on 09/16/15 to pt sister's home if [facility] to deny [facility] for read[facility].  Loletta SpecterSuzanna Nazariah Cadet, MSW, LCSW Clinical Social Work [phone]

## 2015-09-16 LAB — RENAL FUNCTION PANEL
ALBUMIN: 2.8 g/dL — AB (ref 3.5–5.0)
Anion gap: 6 (ref 5–15)
BUN: 22 mg/dL — AB (ref 6–20)
CO2: 30 mmol/L (ref 22–32)
Calcium: 9.3 mg/dL (ref 8.9–10.3)
Chloride: 101 mmol/L (ref 101–111)
Creatinine, Ser: 0.9 mg/dL (ref 0.44–1.00)
GFR calc Af Amer: >60 mL/min (ref >60)
GFR calc non Af Amer: >60 mL/min (ref >60)
GLUCOSE: 128 mg/dL — AB (ref 65–99)
PHOSPHORUS: 4.1 mg/dL (ref 2.5–4.6)
POTASSIUM: 4.1 mmol/L (ref 3.5–5.1)
SODIUM: 137 mmol/L (ref 135–145)

## 2015-09-16 NOTE — Discharge Summary (Signed)
Physician Discharge Summary  Abigail Santiago TFT:[id number] DOB: [birth year] DOA: 09/07/2015  PCP: Abigail Fantasia, MD  Admit date: 09/07/2015 Discharge date: 09/16/2015  Time spent: 45 minutes  Recommendations for Outpatient Follow-up:  1. Follow-up with primary care physician within one week. 2. Augmentin for 5 more days.   Discharge Diagnoses:  Principal Problem:   HCAP (healthcare-associated pneumonia) Active Problems:   Sepsis, unspecified organism ([place])   Alcohol dependence ([place])   Hypokalemia   Elevated troponin   Macrocytic anemia   Cirrhosis of liver ([place]): Per CT 09/07/2015   Depression   GAD (generalized anxiety disorder)   DVT (deep venous thrombosis) ([place]): hX OF MULTIPLE DVT PER PATIENT   Hyperlipidemia   GERD (gastroesophageal reflux disease)   Anemia, iron deficiency   Abnormal TSH   Volume overload   Community acquired pneumonia   Discharge Condition: Stable  Diet recommendation: Heart healthy  Filed Weights   09/14/15 0615 09/15/15 0548 09/16/15 0554  Weight: 73.1 kg (161 lb 2.5 oz) 71.532 kg (157 lb 11.2 oz) 71.94 kg (158 lb 9.6 oz)    History of present illness:  Abigail Santiago is a 64 y.o. 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 Patient stated she felt so bad that she fell asleep and to lectures. Patient denied any chest pain, no diarrhea, no constipation, no dysuria, no hematemesis, no hematochezia, no melena. EMS was called and per ED notes upon arrival patient's O2  sats was 60-70% and patient was placed on 3 L nasal cannula with an increase in the sats to 92%. Patient was noted to be wheezing at the time and given an albuterol nebulizer and dual nebs as well as Solu-Medrol 125 mg IV push. Patient was seen in the ED, patient was noted to be tachycardic with heart rates as high as 129 with a temp of 100.9 respiratory rate of up to 31 and initial systolic blood pressure 706/23. ABG obtained had a pH of 7.34 PCO2 of 44 PO2 of 75 otherwise was within normal limits. Lactic acid level was elevated at 2.94. INR was 1.22., Troponin was 0.24. CBC had a hemoglobin of 11.5 with a MCV of 111.8 otherwise was within normal limits. Comprehensive metabolic profile at a potassium of 3.2 chloride of 100 BUN of 25 creatinine of 1.02 glucose of 243 albumin of 3.4 AST of 48 otherwise was within normal limits. Chest x-ray does show bilateral upper lobe airspace opacities with possible cavitary process involving the left upper lobe. CT of the chest which was done showed severe multilobar pneumonia, mild cardiomegaly, morphological changes in the liver compatible with cirrhosis. Patient was given IV vancomycin and IV Zosyn as she did recently been hospitalized at [facility] within the past 3 months. [facility] will call to admit the patient for further evaluation and management.  Hospital Course:   Multilobar healthcare associated pneumonia Patient had presented with fevers, shortness of breath, clinical findings consistent with a pneumonia Elevated lactic acid level, chest x-ray and CT findings concerning for multilobar pneumonia.  Blood sputum culture did not show specific bacteria. Urine streptococcus antigen is negative. Urine Legionella antigen is negative.  Treated with IV Zosyn, continue oxygen, Mucinex, nebulizers and antitussives. Discharged on Augmentin for 5 more days, Mucinex as well.  Sepsis likely secondary to healthcare associated pneumonia Patient met  criteria for sepsis with tachycardia, tachypnea, borderline blood pressure in the presence of PNA. hypoxia when patient was first picked up by EMS with sats in the 60s to 70s. Lactic acid was also elevated. Hypoxia has improved. Lactic acid trended down.   Acute hypoxic respiratory failure Oxygen saturation was in the 60s to 70s on admission, patient requires 3-3.5 L of oxygen to keep sats above 90%. This is likely secondary to multi lobar pneumonia, CT scan showed no evidence of PE, she is on PRADAXA as well. Currently on room air, ambulating without desaturation episodes.  Volume overload Patient had worsening of her SOB after admission which is likely secondary to iatrogenic fluid overload. Diffuse bilateral crackles on examination, started on Lasix 40 mg IV twice a day. Had a very good response with diuresis of 8.4 L and total negative of 6.9 L. Please note that patient has history of alcohol abuse, her CT scan questioned cirrhosis, if patient develops any type of fluid overload please consider diuretics.  Hypokalemia/hypomagnesemia Repleted, secondary to diuresis.  Elevated troponin Likely secondary to sepsis and current infection. Cardiac enzymes trending down.  2-D echo with EF of 55-60% with trivial aortic valvular regurgitation. No further workup needed at this time.   Iron deficiency anemia/microcytic anemia Patient with no overt bleeding. Anemia panel consistent with iron deficiency anemia with iron level of 8 and a ferritin of 153 with a folate of 31, B-12 = 304.Hemoglobin currently at 10,0 from 11.5 on admission. Partial dilutional effect. Patient has received IV iron dose. Will likely need to be on oral iron supplementation on discharge.  History of DVTs Pradaxa.  Gastroesophageal reflux disease PPI.  History of alcohol dependence Patient was at [name] for alcohol rehabilitation/detox prior to admission. Continue the Ativan withdrawal protocol. Continue  thiamine, multivitamin, folic acid. Will likely need to go back to fellowship [facility] on discharge.  ?? Hyperlipidemia Fasting lipid panel not indicative of hyperlipidemia.Total cholesterol is 121. HDL is 84. LDL is 30. No need for any medications at this time. Outpatient follow-up.  Depression/general anxiety disorder Stable. Continue Cymbalta and BuSpar.  Cirrhosis of the liver per CT scan Will likely need outpatient follow-up. Patient at [facility] for alcohol [facility]/rehabilitation.  Abnormal TSH Free T4 within normal limits. T3 pending. Likely euthyroid sick syndrome. Patient will likely need repeat thyroid function studies done in about 4-6 weeks.    Procedures:  2-D echo done on 09/08/2015. Study Conclusions  - Left ventricle: The cavity size was normal. Wall thickness was normal. Systolic function was normal. The estimated ejection fraction was in the range of 55% to 60%. - Aortic valve: There was trivial regurgitation.  Consultations:  None  Discharge Exam: Filed Vitals:   09/16/15 0554  BP: 131/74  Pulse: 87  Temp: 98.6 F (37 C)  Resp: 20   General: Alert and awake, oriented x3, not in any acute distress. HEENT: anicteric sclera, pupils reactive to light and accommodation, EOMI CVS: S1-S2 clear, no murmur rubs or gallops Chest: clear to auscultation bilaterally, no wheezing, rales or rhonchi Abdomen: soft nontender, nondistended, normal bowel sounds, no organomegaly Extremities: no cyanosis, clubbing or edema noted bilaterally Neuro: Cranial nerves II-XII intact, no focal neurological deficits  Discharge Instructions   Discharge Instructions    Diet - low sodium heart healthy  Complete by:  As directed      Increase activity slowly    Complete by:  As directed           Current Discharge Medication List    START taking these medications   Details  amoxicillin-clavulanate (AUGMENTIN) 875-125 MG tablet Take 1 tablet by mouth 2 (two) times  daily. Qty: 10 tablet, Refills: 0      CONTINUE these medications which have CHANGED   Details  guaiFENesin (MUCINEX) 600 MG 12 hr tablet Take 2 tablets (1,200 mg total) by mouth 2 (two) times daily. Qty: 30 tablet, Refills: 0      CONTINUE these medications which have NOT CHANGED   Details  alum & mag hydroxide-simeth (MAALOX/MYLANTA) 200-200-20 MG/5ML suspension Take 30 mLs by mouth every 6 (six) hours as needed for indigestion or heartburn.    anti-nausea (EMETROL) solution Take 5-10 mLs by mouth every 15 (fifteen) minutes as needed for nausea or vomiting.    busPIRone (BUSPAR) 15 MG tablet Take 15 mg by mouth 3 (three) times daily.    Calcium Citrate-Vitamin D (CALCIUM + D PO) Take 1 tablet by mouth daily.    cetirizine (ZYRTEC) 10 MG tablet Take 10 mg by mouth daily as needed for allergies.    Cholecalciferol (VITAMIN D PO) Take 1 capsule by mouth daily.    dabigatran (PRADAXA) 150 MG CAPS capsule Take 150 mg by mouth 2 (two) times daily.    dicyclomine (BENTYL) 20 MG tablet Take 20 mg by mouth 2 (two) times daily as needed for spasms.    DULoxetine (CYMBALTA) 60 MG capsule Take 60 mg by mouth daily.    gabapentin (NEURONTIN) 600 MG tablet Take 600 mg by mouth 3 (three) times daily.    lidocaine (LIDODERM) 5 % Place 1 patch onto the skin daily. Remove & Discard patch within 12 hours or as directed by MD    lisinopril (PRINIVIL,ZESTRIL) 20 MG tablet Take 20 mg by mouth daily.    loperamide (IMODIUM A-D) 2 MG tablet Take 2 mg by mouth 4 (four) times daily as needed for diarrhea or loose stools.    magnesium oxide (MAG-OX) 400 MG tablet Take 400 mg by mouth 3 (three) times daily.    Multiple Vitamin (MULTIVITAMIN WITH MINERALS) TABS tablet Take 1 tablet by mouth daily.    ondansetron (ZOFRAN) 8 MG tablet Take 8 mg by mouth 2 (two) times daily as needed for nausea or vomiting.    pantoprazole (PROTONIX) 40 MG tablet Take 40 mg by mouth daily.    thiamine 100 MG tablet  Take 100 mg by mouth daily.    traZODone (DESYREL) 50 MG tablet Take 50 mg by mouth at bedtime.      STOP taking these medications     benzocaine-menthol (CHLORASEPTIC) 6-10 MG lozenge      benzonatate (TESSALON) 100 MG capsule      chlordiazePOXIDE (LIBRIUM) 5 MG capsule      diazepam (VALIUM) 5 MG/ML injection      ibuprofen (ADVIL,MOTRIN) 600 MG tablet      methocarbamol (ROBAXIN) 500 MG tablet        No Known Allergies Follow-up Information    Follow up with LEHR,JANET, MD In 1 week.   Specialty:  Family Medicine       The results of significant diagnostics from this hospitalization (including imaging, microbiology, ancillary and laboratory) are listed below for reference.    Significant Diagnostic Studies: Dg Chest 2 View  09/13/2015  CLINICAL   DATA:  Shortness of breath, pneumonia, pulmonary edema EXAM: CHEST  2 VIEW COMPARISON:  09/10/2015 FINDINGS: Multifocal patchy opacities in the bilateral upper lobes and left lower lobe, improved. No pleural effusion or pneumothorax. The heart is top-normal in size. IVC filter. Degenerative changes of the visualized thoracolumbar spine. IMPRESSION: Multifocal focal patchy opacities, right upper lobe predominant, improved. This overall appearance suggests improving multifocal pneumonia. Electronically Signed   By: Julian Hy M.D.   On: 09/13/2015 09:01   Dg Chest 2 View  09/07/2015  CLINICAL DATA:  Weakness, dizziness, shortness of breath and cough EXAM: CHEST  2 VIEW COMPARISON:  None. FINDINGS: Heart size is normal. No pleural effusion identified. Asymmetric elevation of right hemidiaphragm identified. There are bilateral, upper lobe predominant pulmonary opacities left greater than right. There may be a cavitary process involving the left upper lobe. The visualized osseous structures appear intact. IMPRESSION: 1. Bilateral, upper lobe predominant airspace opacities with possible cavitary process involving the left upper lobe.  Recommend further evaluation with CT of the chest. Electronically Signed   By: Kerby Moors M.D.   On: 09/07/2015 10:05   Ct Chest W Contrast  09/07/2015  CLINICAL DATA:  64 year old female with a history of drug abuse. Difficulty breathing this morning. Hypoxemia. EXAM: CT CHEST WITH CONTRAST TECHNIQUE: Multidetector CT imaging of the chest was performed during intravenous contrast administration. CONTRAST:  19m OMNIPAQUE IOHEXOL 300 MG/ML  SOLN COMPARISON:  No priors. FINDINGS: Mediastinum/Lymph Nodes: Heart size is mildly enlarged. There is no significant pericardial fluid, thickening or pericardial calcification. No pathologically enlarged mediastinal or hilar lymph nodes. Small hiatal hernia. No axillary lymphadenopathy. Lungs/Pleura: Widespread peribronchovascular ground-glass attenuation and thickening of the peribronchovascular interstitium with extensive architectural distortion throughout the lungs bilaterally. No craniocaudal gradient. No pleural effusions. Study is limited by extensive respiratory motion. Upper Abdomen: Liver has a shrunken appearance and nodular contour, compatible with underlying cirrhosis. Musculoskeletal/Soft Tissues: There are no aggressive appearing lytic or blastic lesions noted in the visualized portions of the skeleton. Compression fracture of superior endplate of TX21with approximately 15% loss of anterior vertebral body height, likely chronic. IMPRESSION: 1. Severe multilobar pneumonia, as above. 2. Mild cardiomegaly. 3. Morphologic changes in the liver compatible with cirrhosis. 4. Small hiatal hernia. Electronically Signed   By: DVinnie LangtonM.D.   On: 09/07/2015 12:33   Mr Brain Wo Contrast  09/04/2015  CLINICAL DATA:  Dizziness. Imbalance. Alcohol abuse with recent withdrawal. EXAM: MRI HEAD WITHOUT CONTRAST TECHNIQUE: Multiplanar, multiecho pulse sequences of the brain and surrounding structures were obtained without intravenous contrast. COMPARISON:  None.  FINDINGS: Mild atrophy and periventricular T2 changes bilaterally are within normal limits for age. No acute infarct, hemorrhage, or mass lesion is present. The ventricles are of normal size. No significant extraaxial fluid collection is present. Mild white matter changes extend into the brainstem. The cerebellum is unremarkable. Flow is present in the major intracranial arteries. Globes orbits are intact. The paranasal sinuses and mastoid air cells are clear. The skullbase is within normal limits. Midline structures are unremarkable. IMPRESSION: 1. Mild atrophy and white matter changes are within normal limits for age. 2. No acute or focal intracranial abnormality to explain the patient's symptoms. Electronically Signed   By: CSan MorelleM.D.   On: 09/04/2015 20:18   Dg Chest Port 1 View  09/10/2015  CLINICAL DATA:  Shortness of breath and hypoxia EXAM: PORTABLE CHEST - 1 VIEW COMPARISON:  09/09/2015 FINDINGS: Cardiac shadow is within normal limits. Vascular congestion is  again identified although the degree of congestion and edema it is improved when compare with the prior exam. Persistent upper lobe infiltrates are seen but mildly improved when compare with the prior study. No new focal infiltrate is seen. IMPRESSION: Overall improvement in vascular congestion and interstitial edema as well as bilateral upper lobe infiltrates. Continued followup is recommended. Electronically Signed   By: Inez Catalina M.D.   On: 09/10/2015 16:08   Dg Chest Port 1 View  09/09/2015  CLINICAL DATA:  Shortness of breath . EXAM: PORTABLE CHEST 1 VIEW COMPARISON:  CT 09/07/2015.  Chest x-ray 09/07/2015. FINDINGS: Cardiomegaly diffuse bilateral from interstitial prominence particular the upper lobes consistent with congestive heart failure. Bilateral upper lobe pneumonia, right side greater than left cannot be excluded. Cavitary infiltrate left upper lobe again cannot be excluded. No prominent pleural effusion or  pneumothorax. IMPRESSION: 1. Cardiomegaly with interim progression of diffuse bilateral pulmonary interstitial prominence consistent with congestive heart failure. 2. Bilateral upper lobe pulmonary infiltrates, right side greater than left. Pneumonia cannot be excluded. Cavitary infiltrate in the left upper lobe again cannot be excluded. Electronically Signed   By: Marcello Moores  Register   On: 09/09/2015 09:14    Microbiology: Recent Results (from the past 240 hour(s))  Blood culture (routine x 2)     Status: None   Collection Time: 09/07/15 11:17 AM  Result Value Ref Range Status   Specimen Description BLOOD RIGHT HAND  Final   Special Requests BOTTLES DRAWN AEROBIC AND ANAEROBIC 5CC  Final   Culture   Final    NO GROWTH 5 DAYS Performed at [facility]    Report Status 09/12/2015 FINAL  Final  Blood culture (routine x 2)     Status: None   Collection Time: 09/07/15 11:26 AM  Result Value Ref Range Status   Specimen Description BLOOD LEFT HAND  Final   Special Requests BOTTLES DRAWN AEROBIC AND ANAEROBIC 5CC  Final   Culture   Final    NO GROWTH 5 DAYS Performed at [facility]    Report Status 09/12/2015 FINAL  Final  Urine culture     Status: None   Collection Time: 09/07/15  4:00 PM  Result Value Ref Range Status   Specimen Description URINE, CLEAN CATCH  Final   Special Requests NONE  Final   Culture   Final    NO GROWTH 1 DAY Performed at [facility]    Report Status 09/08/2015 FINAL  Final  MRSA PCR Screening     Status: None   Collection Time: 09/07/15  4:03 PM  Result Value Ref Range Status   MRSA by PCR NEGATIVE NEGATIVE Final    Comment:        The GeneXpert MRSA Assay (FDA approved for NASAL specimens only), is one component of a comprehensive MRSA colonization surveillance program. It is not intended to diagnose MRSA infection nor to guide or monitor treatment for MRSA infections.      Labs: Basic Metabolic Panel:  Recent Labs Lab  09/10/15 0325 09/12/15 0515 09/13/15 0500 09/14/15 0500 09/15/15 0441 09/16/15 0416  NA 138 136 134* 137 134* 137  K 3.8 3.4* 4.1 3.9 3.6 4.1  CL 98* 95* 94* 97* 97* 101  CO2 33* 32 32 32 29 30  GLUCOSE 124* 150* 143* 135* 123* 128*  BUN 24* 29* 27* 28* 28* 22*  CREATININE 1.11* 0.95 1.02* 1.09* 0.96 0.90  CALCIUM 8.8* 9.1 9.0 9.5 9.2 9.3  MG 1.9  --  2.2  --   --   --   PHOS  --  3.8 3.4 4.5 4.1 4.1   Liver Function Tests:  Recent Labs Lab 09/12/15 0515 09/13/15 0500 09/14/15 0500 09/15/15 0441 09/16/15 0416  ALBUMIN 2.8* 2.9* 3.0* 2.9* 2.8*   No results for input(s): LIPASE, AMYLASE in the last 168 hours. No results for input(s): AMMONIA in the last 168 hours. CBC:  Recent Labs Lab 09/10/15 0325  WBC 8.6  NEUTROABS 6.0  HGB 9.4*  HCT 28.3*  MCV 111.9*  PLT 183   Cardiac Enzymes: No results for input(s): CKTOTAL, CKMB, CKMBINDEX, TROPONINI in the last 168 hours. BNP: BNP (last 3 results)  Recent Labs  09/07/15 0850  BNP 191.0*    ProBNP (last 3 results) No results for input(s): PROBNP in the last 8760 hours.  CBG: No results for input(s): GLUCAP in the last 168 hours.     Signed:  Chamille Werntz A  [facility] 09/16/2015, 9:56 AM

## 2015-09-16 NOTE — Progress Notes (Signed)
Subjective: Patient feels stronger than yesterday, walk around without help. Did not need oxygen, no desaturation episodes.  Objective: Filed Vitals:   09/16/15 0554  BP: 131/74  Pulse: 87  Temp: 98.6 F (37 C)  Resp: 20  General: Alert and awake, oriented x3, not in any acute distress. HEENT: anicteric sclera, pupils reactive to light and accommodation, EOMI CVS: S1-S2 clear, no murmur rubs or gallops Chest: clear to auscultation bilaterally, no wheezing, rales or rhonchi Abdomen: soft nontender, nondistended, normal bowel sounds, no organomegaly Extremities: no cyanosis, clubbing or edema noted bilaterally Neuro: Cranial nerves II-XII intact, no focal neurological deficits   Assessment and plan: Principal Problem:   HCAP (healthcare-associated pneumonia) Active Problems:   Sepsis, unspecified organism ([facility])   Alcohol dependence ([facility])   Hypokalemia   Elevated troponin   Macrocytic anemia   Cirrhosis of liver ([facility]): Per CT 09/07/2015   Depression   GAD (generalized anxiety disorder)   DVT (deep venous thrombosis) ([facility]): hX OF MULTIPLE DVT PER PATIENT   Hyperlipidemia   GERD (gastroesophageal reflux disease)   Anemia, iron deficiency   Abnormal TSH   Volume overload   Community acquired pneumonia   Patient will be discharged today, on 5 more days of Augmentin.  Clint LippsMutaz A Aric Jost Pager: [phone] 09/16/2015, 9:58 AM

## 2015-09-16 NOTE — Progress Notes (Signed)
Pt for discharge today.   CSW followed up with [facility] this morning and discussed with facility that pt has demonstrated that she is feeling a lot stronger and has walked the unit multiple times. [facility] states that [facility] can [facility] pt back today.  CSW updated pt at bedside and pt sister, Clydie BraunKaren via telephone. Pt relieved that [facility]. CSW provided report phone number to RN. Per [facility] to pick pt up at 2 pm and will call nursing unit upon arrival. Discharge packet provided to RN.   Pt and pt sister appreciative of CSW assistance.   No further social work needs identified at this time.  CSW signing off.   Loletta SpecterSuzanna Elynore Dolinski, MSW, LCSW Clinical Social Work [phone]

## 2017-10-05 IMAGING — CR DG CHEST 1V PORT
1 series · 2 of 2 positions shown · non-contrast
Comparison: 09/09/2015

CLINICAL DATA: Shortness of breath and hypoxia

EXAM:
PORTABLE CHEST - 1 VIEW

[Series 1: AP · U · 2 of 2 slices shown]
[im 1/2]
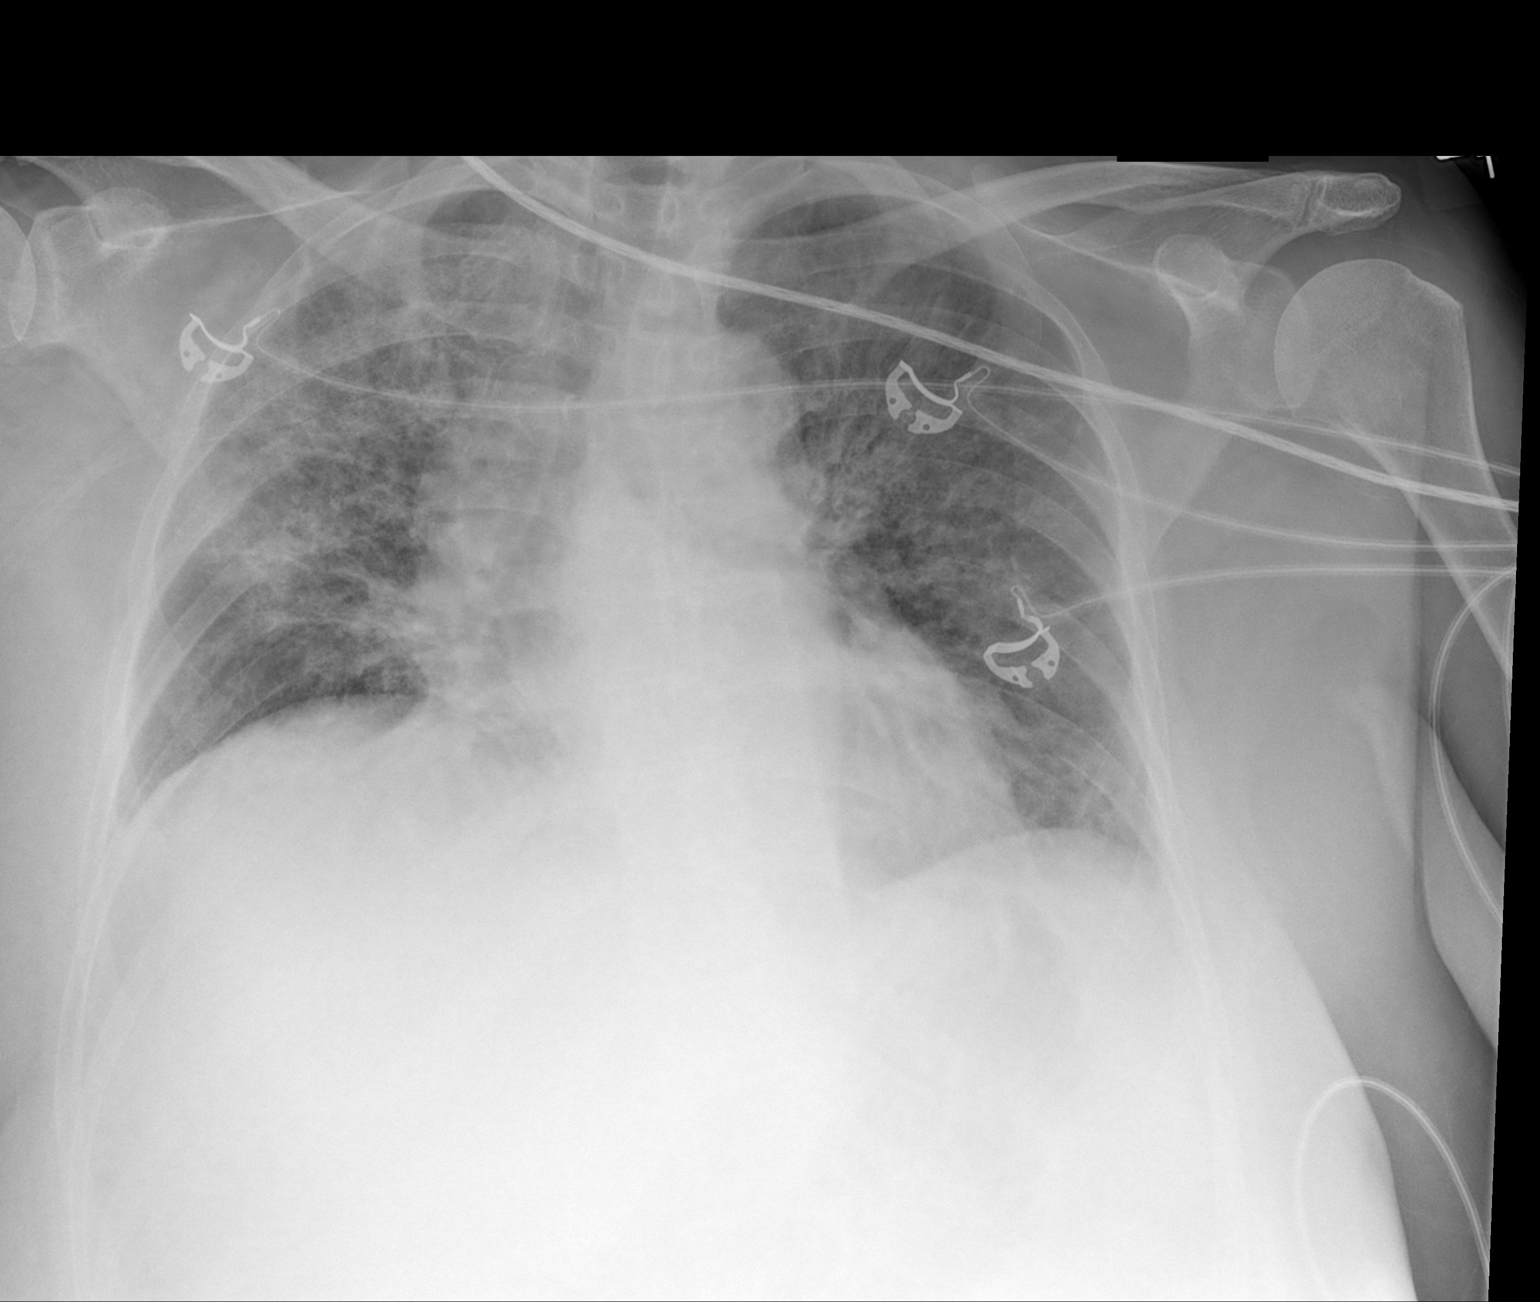
[im 2/2]
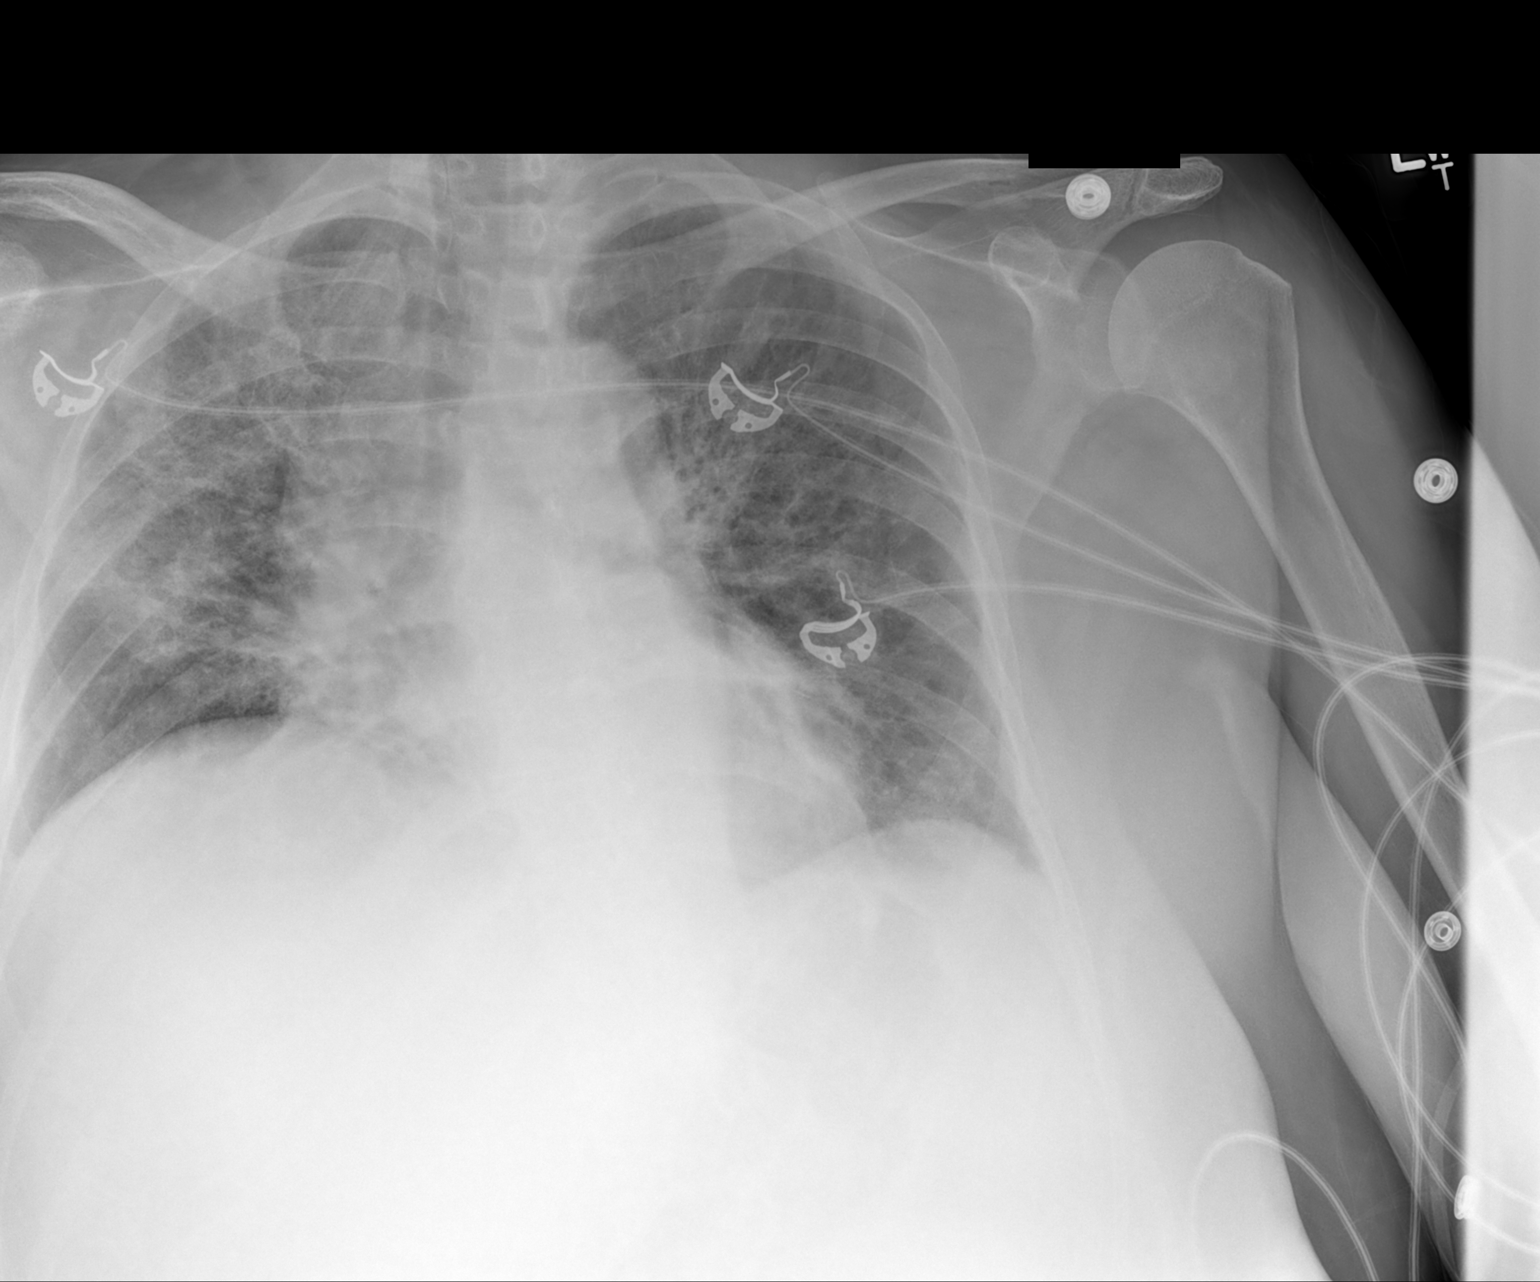

[2 of 2 positions shown; findings below may reference images not displayed]

FINDINGS: Cardiac shadow is within normal limits. Vascular congestion is again
identified although the degree of congestion and edema it is
improved when compare with the prior exam. Persistent upper lobe
infiltrates are seen but mildly improved when compare with the prior
study. No new focal infiltrate is seen.
IMPRESSION: Overall improvement in vascular congestion and interstitial edema as
well as bilateral upper lobe infiltrates. Continued followup is
recommended.

## 2024-11-11 DIAGNOSIS — J849 Interstitial pulmonary disease, unspecified: Secondary | ICD-10-CM | POA: Diagnosis not present

## 2024-11-11 DIAGNOSIS — J9621 Acute and chronic respiratory failure with hypoxia: Secondary | ICD-10-CM | POA: Diagnosis not present

## 2024-11-11 DIAGNOSIS — Z930 Tracheostomy status: Secondary | ICD-10-CM | POA: Diagnosis not present

## 2024-11-11 DIAGNOSIS — R1311 Dysphagia, oral phase: Secondary | ICD-10-CM | POA: Diagnosis not present

## 2024-11-12 DIAGNOSIS — J849 Interstitial pulmonary disease, unspecified: Secondary | ICD-10-CM | POA: Diagnosis not present

## 2024-11-12 DIAGNOSIS — R1311 Dysphagia, oral phase: Secondary | ICD-10-CM | POA: Diagnosis not present

## 2024-11-12 DIAGNOSIS — J9621 Acute and chronic respiratory failure with hypoxia: Secondary | ICD-10-CM | POA: Diagnosis not present

## 2024-11-12 DIAGNOSIS — Z930 Tracheostomy status: Secondary | ICD-10-CM | POA: Diagnosis not present

## 2024-11-13 DIAGNOSIS — J849 Interstitial pulmonary disease, unspecified: Secondary | ICD-10-CM | POA: Diagnosis not present

## 2024-11-13 DIAGNOSIS — Z930 Tracheostomy status: Secondary | ICD-10-CM | POA: Diagnosis not present

## 2024-11-13 DIAGNOSIS — R1311 Dysphagia, oral phase: Secondary | ICD-10-CM | POA: Diagnosis not present

## 2024-11-13 DIAGNOSIS — J9621 Acute and chronic respiratory failure with hypoxia: Secondary | ICD-10-CM | POA: Diagnosis not present

## 2024-11-14 DIAGNOSIS — J849 Interstitial pulmonary disease, unspecified: Secondary | ICD-10-CM | POA: Diagnosis not present

## 2024-11-14 DIAGNOSIS — R1311 Dysphagia, oral phase: Secondary | ICD-10-CM | POA: Diagnosis not present

## 2024-11-14 DIAGNOSIS — J9621 Acute and chronic respiratory failure with hypoxia: Secondary | ICD-10-CM | POA: Diagnosis not present

## 2024-11-14 DIAGNOSIS — Z930 Tracheostomy status: Secondary | ICD-10-CM | POA: Diagnosis not present

## 2024-11-15 DIAGNOSIS — J849 Interstitial pulmonary disease, unspecified: Secondary | ICD-10-CM | POA: Diagnosis not present

## 2024-11-15 DIAGNOSIS — J9621 Acute and chronic respiratory failure with hypoxia: Secondary | ICD-10-CM | POA: Diagnosis not present

## 2024-11-15 DIAGNOSIS — R1311 Dysphagia, oral phase: Secondary | ICD-10-CM | POA: Diagnosis not present

## 2024-11-15 DIAGNOSIS — Z930 Tracheostomy status: Secondary | ICD-10-CM | POA: Diagnosis not present

## 2024-11-16 DIAGNOSIS — J849 Interstitial pulmonary disease, unspecified: Secondary | ICD-10-CM | POA: Diagnosis not present

## 2024-11-16 DIAGNOSIS — J9621 Acute and chronic respiratory failure with hypoxia: Secondary | ICD-10-CM | POA: Diagnosis not present

## 2024-11-16 DIAGNOSIS — Z930 Tracheostomy status: Secondary | ICD-10-CM | POA: Diagnosis not present

## 2024-11-16 DIAGNOSIS — R1311 Dysphagia, oral phase: Secondary | ICD-10-CM | POA: Diagnosis not present

## 2024-11-17 DIAGNOSIS — J9621 Acute and chronic respiratory failure with hypoxia: Secondary | ICD-10-CM | POA: Diagnosis not present

## 2024-11-17 DIAGNOSIS — R1311 Dysphagia, oral phase: Secondary | ICD-10-CM | POA: Diagnosis not present

## 2024-11-17 DIAGNOSIS — J849 Interstitial pulmonary disease, unspecified: Secondary | ICD-10-CM | POA: Diagnosis not present

## 2024-11-17 DIAGNOSIS — Z930 Tracheostomy status: Secondary | ICD-10-CM | POA: Diagnosis not present

## 2024-11-18 DIAGNOSIS — Z930 Tracheostomy status: Secondary | ICD-10-CM | POA: Diagnosis not present

## 2024-11-18 DIAGNOSIS — J9621 Acute and chronic respiratory failure with hypoxia: Secondary | ICD-10-CM | POA: Diagnosis not present

## 2024-11-18 DIAGNOSIS — R1311 Dysphagia, oral phase: Secondary | ICD-10-CM | POA: Diagnosis not present

## 2024-11-18 DIAGNOSIS — J849 Interstitial pulmonary disease, unspecified: Secondary | ICD-10-CM | POA: Diagnosis not present

## 2024-11-19 DIAGNOSIS — J849 Interstitial pulmonary disease, unspecified: Secondary | ICD-10-CM | POA: Diagnosis not present

## 2024-11-19 DIAGNOSIS — J9621 Acute and chronic respiratory failure with hypoxia: Secondary | ICD-10-CM | POA: Diagnosis not present

## 2024-11-19 DIAGNOSIS — R1311 Dysphagia, oral phase: Secondary | ICD-10-CM | POA: Diagnosis not present

## 2024-11-19 DIAGNOSIS — Z930 Tracheostomy status: Secondary | ICD-10-CM | POA: Diagnosis not present

## 2024-11-20 DIAGNOSIS — R1311 Dysphagia, oral phase: Secondary | ICD-10-CM | POA: Diagnosis not present

## 2024-11-20 DIAGNOSIS — Z930 Tracheostomy status: Secondary | ICD-10-CM | POA: Diagnosis not present

## 2024-11-20 DIAGNOSIS — J9621 Acute and chronic respiratory failure with hypoxia: Secondary | ICD-10-CM | POA: Diagnosis not present

## 2024-11-20 DIAGNOSIS — J849 Interstitial pulmonary disease, unspecified: Secondary | ICD-10-CM | POA: Diagnosis not present

## 2024-11-21 DIAGNOSIS — R1311 Dysphagia, oral phase: Secondary | ICD-10-CM | POA: Diagnosis not present

## 2024-11-21 DIAGNOSIS — J9621 Acute and chronic respiratory failure with hypoxia: Secondary | ICD-10-CM | POA: Diagnosis not present

## 2024-11-21 DIAGNOSIS — Z930 Tracheostomy status: Secondary | ICD-10-CM | POA: Diagnosis not present

## 2024-11-21 DIAGNOSIS — J849 Interstitial pulmonary disease, unspecified: Secondary | ICD-10-CM | POA: Diagnosis not present

## 2024-11-22 DIAGNOSIS — R1311 Dysphagia, oral phase: Secondary | ICD-10-CM | POA: Diagnosis not present

## 2024-11-22 DIAGNOSIS — J849 Interstitial pulmonary disease, unspecified: Secondary | ICD-10-CM | POA: Diagnosis not present

## 2024-11-22 DIAGNOSIS — Z930 Tracheostomy status: Secondary | ICD-10-CM | POA: Diagnosis not present

## 2024-11-22 DIAGNOSIS — J9621 Acute and chronic respiratory failure with hypoxia: Secondary | ICD-10-CM | POA: Diagnosis not present

## 2024-11-23 DIAGNOSIS — J9621 Acute and chronic respiratory failure with hypoxia: Secondary | ICD-10-CM | POA: Diagnosis not present

## 2024-11-23 DIAGNOSIS — J849 Interstitial pulmonary disease, unspecified: Secondary | ICD-10-CM | POA: Diagnosis not present

## 2024-11-23 DIAGNOSIS — Z930 Tracheostomy status: Secondary | ICD-10-CM | POA: Diagnosis not present

## 2024-11-23 DIAGNOSIS — R1311 Dysphagia, oral phase: Secondary | ICD-10-CM | POA: Diagnosis not present

## 2024-11-25 DIAGNOSIS — R1311 Dysphagia, oral phase: Secondary | ICD-10-CM | POA: Diagnosis not present

## 2024-11-25 DIAGNOSIS — J849 Interstitial pulmonary disease, unspecified: Secondary | ICD-10-CM | POA: Diagnosis not present

## 2024-11-25 DIAGNOSIS — J9621 Acute and chronic respiratory failure with hypoxia: Secondary | ICD-10-CM | POA: Diagnosis not present

## 2024-11-25 DIAGNOSIS — Z930 Tracheostomy status: Secondary | ICD-10-CM | POA: Diagnosis not present
